# Patient Record
Sex: Male | Born: 1960 | Race: White | Hispanic: No | Marital: Married | Smoking: Former smoker
Health system: Southern US, Community
[De-identification: ages and names within clinical notes are randomized; demographics above are authoritative.]

## PROBLEM LIST (undated history)

## (undated) DIAGNOSIS — F419 Anxiety disorder, unspecified: Secondary | ICD-10-CM

## (undated) DIAGNOSIS — G4730 Sleep apnea, unspecified: Secondary | ICD-10-CM

## (undated) DIAGNOSIS — F329 Major depressive disorder, single episode, unspecified: Secondary | ICD-10-CM

## (undated) DIAGNOSIS — F32A Depression, unspecified: Secondary | ICD-10-CM

## (undated) DIAGNOSIS — G250 Essential tremor: Secondary | ICD-10-CM

## (undated) DIAGNOSIS — E119 Type 2 diabetes mellitus without complications: Secondary | ICD-10-CM

## (undated) HISTORY — DX: Essential tremor: G25.0

## (undated) HISTORY — DX: Depression, unspecified: F32.A

## (undated) HISTORY — DX: Anxiety disorder, unspecified: F41.9

## (undated) HISTORY — PX: CATARACT EXTRACTION W/ INTRAOCULAR LENS IMPLANT: SHX1309

## (undated) HISTORY — DX: Sleep apnea, unspecified: G47.30

## (undated) HISTORY — DX: Type 2 diabetes mellitus without complications: E11.9

## (undated) HISTORY — DX: Major depressive disorder, single episode, unspecified: F32.9

---
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
## 1998-10-03 ENCOUNTER — Other Ambulatory Visit: Admission: RE | Admit: 1998-10-03 | Discharge: 1998-10-03 | Payer: Self-pay | Admitting: Urology

## 1998-11-18 ENCOUNTER — Emergency Department (HOSPITAL_COMMUNITY): Admission: EM | Admit: 1998-11-18 | Discharge: 1998-11-18 | Payer: Self-pay | Admitting: *Deleted

## 1998-11-18 ENCOUNTER — Encounter: Payer: Self-pay | Admitting: Urology

## 1999-03-06 ENCOUNTER — Encounter: Payer: Self-pay | Admitting: Urology

## 1999-03-06 ENCOUNTER — Ambulatory Visit (HOSPITAL_COMMUNITY): Admission: RE | Admit: 1999-03-06 | Discharge: 1999-03-06 | Payer: Self-pay | Admitting: Urology

## 2006-03-02 ENCOUNTER — Ambulatory Visit (HOSPITAL_COMMUNITY): Admission: RE | Admit: 2006-03-02 | Discharge: 2006-03-02 | Payer: Self-pay | Admitting: Gastroenterology

## 2006-03-16 HISTORY — PX: OTHER SURGICAL HISTORY: SHX169

## 2006-06-28 ENCOUNTER — Ambulatory Visit (HOSPITAL_BASED_OUTPATIENT_CLINIC_OR_DEPARTMENT_OTHER): Admission: RE | Admit: 2006-06-28 | Discharge: 2006-06-28 | Payer: Self-pay | Admitting: Family Medicine

## 2006-07-04 ENCOUNTER — Ambulatory Visit: Payer: Self-pay | Admitting: Internal Medicine

## 2006-09-01 ENCOUNTER — Ambulatory Visit (HOSPITAL_COMMUNITY): Admission: RE | Admit: 2006-09-01 | Discharge: 2006-09-01 | Payer: Self-pay | Admitting: Family Medicine

## 2006-09-09 ENCOUNTER — Ambulatory Visit (HOSPITAL_COMMUNITY): Admission: RE | Admit: 2006-09-09 | Discharge: 2006-09-10 | Payer: Self-pay | Admitting: Neurosurgery

## 2008-01-27 ENCOUNTER — Emergency Department (HOSPITAL_COMMUNITY): Admission: EM | Admit: 2008-01-27 | Discharge: 2008-01-27 | Payer: Self-pay | Admitting: Emergency Medicine

## 2008-02-02 ENCOUNTER — Ambulatory Visit (HOSPITAL_COMMUNITY): Admission: RE | Admit: 2008-02-02 | Discharge: 2008-02-02 | Payer: Self-pay | Admitting: Family Medicine

## 2010-07-29 NOTE — Op Note (Signed)
NAMEJEROMIAH, Steven Mays                 ACCOUNT NO.:  [id number]   MEDICAL RECORD NO.:  [id number]          PATIENT TYPE:  OIB   LOCATION:  3027                         FACILITY:  [facility]   PHYSICIAN:  Kathaleen Maser. Pool, M.D.    DATE OF BIRTH:  [birth year]   DATE OF PROCEDURE:  09/09/2006  DATE OF DISCHARGE:                               OPERATIVE REPORT   PREOPERATIVE DIAGNOSIS:  Left L5-S1 herniated nucleus pulposus with  radiculopathy.   POSTOPERATIVE DIAGNOSIS:  Left L5-S1 herniated nucleus pulposus with  radiculopathy.   PROCEDURE NOTE:  Left L5-S1 laminotomy with microdiscectomy.   SURGEON:  Kathaleen Maser. Pool, M.D.   ASSISTANT:  Tia Alert, M.D.   ANESTHESIA:  General orotracheal.   INDICATIONS FOR PROCEDURE:  Mr. Mays is a 50 year old male with a  history of severe back and left lower extremity pain failing  conservative management.  Workup demonstrates evidence of a left-sided  L5-S1 disc herniation with an inferior fragment causing compression of  the thecal sac and left S1 and S2 nerve roots.  The patient has been  counseled as to his options.  He decided to proceed with a left sided L5-  S1 laminotomy and microdiscectomy in hopes of improving his symptoms.   OPERATIVE NOTE:  The patient was brought to the operating room and  placed on the operating table in a supine position. After an adequate  level of anesthesia was achieved, the patient was positioned prone on  the Wilson frame and appropriately padded.  The lumbar region was  prepped and draped sterilely.  A knife was used to make a linear skin  incision overlying the L5-S1 interspace.  This was carried down sharply  in the midline.  A subperiosteal dissection was performed exposing the  lamina and facet joints of L5 and S1 on the left side.  Deep self-  retaining retractor was placed.  Intraoperative x-ray was taken and the  level was confirmed.  A laminotomy was then performed using a high speed  drill and Kerrison  rongeurs to remove the inferior aspect of the lamina  of L5, medial aspect of the L5-S1 facet joint, and the superior rim of  the S1 lamina.  The ligamentum flavum was then elevated and resected in  a piecemeal fashion using Kerrison rongeurs.  The underlying thecal sac  and exiting S1 nerve root were identified.  The microscope was then  brought onto the field and used for microdissection of the left sided S1  nerve root and underlying disc herniation.  Epidural venous plexus was  coagulated and cut.  The thecal sac and S1 nerve root were mobilized and  retracted towards the midline.  The disc herniation was readily  apparent.  This was incised with a 15 blade in rectangular fracture.  Wide disc space clean out was then achieved using pituitary rongeurs, up  angled pituitary rongeurs, and Epstein curets.  All elements of the disc  herniation were completely resected.  All loose or obviously  degenerative disc material was removed from the interspace.  At this  point, a very thorough  discectomy had been achieved.  There was no  evidence of injury to thecal sac or nerve roots.  These was no residual  compression upon the thecal sac or nerve roots.  The wound was irrigated  with antibiotic solution.  Gelfoam was placed topically for hemostasis  which was found to be good.  The microscope and retractor system  were removed.  Hemostasis in the muscles was achieved with  electrocautery and the wound was closed in layers with Vicryl sutures.  Steri-Strips and sterile dressings were applied.  There were no  intraoperative complications.  The patient tolerated the procedure well  and he returns to the recovery room for postoperative care.           ______________________________  Kathaleen Maser. Pool, M.D.     HAP/MEDQ  D:  09/09/2006  T:  09/09/2006  Job:  [id number]

## 2010-08-01 NOTE — Procedures (Signed)
NAME:  Steven Mays, Steven Mays                 ACCOUNT NO.:  [id number]   MEDICAL RECORD NO.:  [id number]          PATIENT TYPE:  OUT   LOCATION:  SLEEP CENTER                 FACILITY[facility]  [facility]   PHYSICIAN:  Clinton D. Maple Hudson, MD, FCCP, FACPDATE OF BIRTH:  03-25-1961   DATE OF STUDY:  06/28/2006                            NOCTURNAL POLYSOMNOGRAM   REFERRING PHYSICIAN:   INDICATION FOR STUDY:  Hypersomnia with sleep apnea.   EPWORTH SLEEPINESS SCORE:  16/24, BMI 27.1, weight 190 pounds.   HOME MEDICATIONS:  Wellbutrin, aspirin.   SLEEP ARCHITECTURE:  Total sleep time 238 minutes with sleep efficiency  60%.  Stage 1 was 32%, stage 2 68%, stages 3, 4, and REM were absent,  sleep latency 16 minutes, awake after sleep onset 140 minutes, arousal  index 33.  There were frequent sustained awakenings throughout the night  until about 3 a.m., which were nonspecific.  No sleep medication was  taken.   RESPIRATORY DATA:  Apnea/hypopnea index (AHI, RDI) 16.1 obstructive  events per hour indicating moderate obstructive sleep apnea/hypopnea  syndrome.  There were 22 obstructive apneas and 42 hypopneas.  Most  events developed very late in the night.  Events were much more common  while supine, although not exclusively in that position.  There were  insufficient early events to permit CPAP titration by split protocol on  this study night.   OXYGEN DATA:  Moderate to occasionally loud snoring with oxygen  desaturation to a nadir of 82%.  Mean oxygen saturation through the  study was 92% on room air.   CARDIAC DATA:  Sinus rhythm with occasional PVC.   MOVEMENT-PARASOMNIA:  A total of 35 limb jerks were recorded of which 19  were associated with arousal or awakening for a periodic limb movement  with arousal index of 4.8 per hour, which is increased.  Bathroom x1.  Technician described the patient as very restless with difficulty  maintaining sleep.   IMPRESSION/RECOMMENDATION:  1. Short total sleep  time lacking in deep and Rapid Eye Movement (REM)      sleep stages and marked by frequent nonspecific waking's through      the night.  The patient appeared to be restless.  Treatment for      insomnia may be warranted.  2. Moderate obstructive sleep apnea/hypopnea syndrome, apnea/hypopnea      index (AHI)16.1 obstructive events per hour.  Events were more      common while supine as expected.  Snoring was moderate to loud with      oxygen desaturation to a nadir of 82%.  3. Sleep was not sustained well enough to permit continuous positive      airway pressure (CPAP) titration on this study night.  Consider      return for continuous positive airway pressure (CPAP) titration      bringing a sleep      medication as appropriate.  4. Mild periodic limb movement with arousal syndrome, 4.8 per hour.      Clinton D. Maple Hudson, MD, FCCP, FACP  Diplomate, Biomedical engineer of Sleep Medicine  Electronically Signed     CDY/MEDQ  D:  07/04/2006  13:11:59  T:  07/04/2006 17:54:45  Job:  [id number]

## 2010-12-31 LAB — CBC
Hemoglobin: 15.1
MCHC: 33.5
RDW: 14.2 — ABNORMAL HIGH

## 2011-12-02 ENCOUNTER — Encounter: Payer: Self-pay | Admitting: Gastroenterology

## 2011-12-28 ENCOUNTER — Ambulatory Visit (AMBULATORY_SURGERY_CENTER): Payer: BC Managed Care – PPO | Admitting: *Deleted

## 2011-12-28 ENCOUNTER — Encounter: Payer: Self-pay | Admitting: Gastroenterology

## 2011-12-28 VITALS — Ht 70.0 in | Wt 235.1 lb

## 2011-12-28 DIAGNOSIS — Z1211 Encounter for screening for malignant neoplasm of colon: Secondary | ICD-10-CM

## 2011-12-28 MED ORDER — NA SULFATE-K SULFATE-MG SULF 17.5-3.13-1.6 GM/177ML PO SOLN: ORAL | Status: DC

## 2012-01-08 ENCOUNTER — Ambulatory Visit (AMBULATORY_SURGERY_CENTER): Payer: BC Managed Care – PPO | Admitting: Gastroenterology

## 2012-01-08 ENCOUNTER — Encounter: Payer: Self-pay | Admitting: Gastroenterology

## 2012-01-08 VITALS — BP 141/86 | HR 90 | Temp 97.6°F | Resp 19 | Ht 70.0 in | Wt 235.0 lb

## 2012-01-08 DIAGNOSIS — D126 Benign neoplasm of colon, unspecified: Secondary | ICD-10-CM

## 2012-01-08 DIAGNOSIS — K5730 Diverticulosis of large intestine without perforation or abscess without bleeding: Secondary | ICD-10-CM

## 2012-01-08 DIAGNOSIS — Z1211 Encounter for screening for malignant neoplasm of colon: Secondary | ICD-10-CM

## 2012-01-08 MED ORDER — SODIUM CHLORIDE 0.9 % IV SOLN: 500.0000 mL | INTRAVENOUS | Status: DC

## 2012-01-08 NOTE — Progress Notes (Signed)
Patient did not experience any of the following events: a burn prior to discharge; a fall within the facility; wrong site/side/patient/procedure/implant event; or a hospital transfer or hospital admission upon discharge from the facility. (G8907) Patient did not have preoperative order for IV antibiotic SSI prophylaxis. (G8918)  

## 2012-01-08 NOTE — Op Note (Signed)
 [facility] [address] [place], [address]   COLONOSCOPY PROCEDURE REPORT  PATIENT: Steven Mays, Steven Mays  MR#: [id number] BIRTHDATE: 1961-01-20 , 51  yrs. old GENDER: Male ENDOSCOPIST: Louis Meckel, MD REFERRED OZ:HYQMVH Ehinger, M.D. PROCEDURE DATE:  01/08/2012 PROCEDURE:   Colonoscopy with snare polypectomy ASA CLASS:   Class II INDICATIONS:average risk screening. MEDICATIONS: MAC sedation, administered by CRNA, Propofol (Diprivan), and Propofol (Diprivan) 230 mg IV  DESCRIPTION OF PROCEDURE:   After the risks benefits and alternatives of the procedure were thoroughly explained, informed consent was obtained.  A digital rectal exam revealed no abnormalities of the rectum.   The LB [id number]  endoscope was introduced through the anus and advanced to the cecum, which was identified by both the appendix and ileocecal valve. No adverse events experienced.   The quality of the prep was Suprep excellent The instrument was then slowly withdrawn as the colon was fully examined.      COLON FINDINGS: A sessile polyp measuring 3 mm in size was found in the sigmoid colon.  A polypectomy was performed with a cold snare. The resection was complete and the polyp tissue was completely retrieved.   Mild diverticulosis was noted in the sigmoid colon. The colon mucosa was otherwise normal.  Retroflexed views revealed no abnormalities. The time to cecum=2 minutes 21 seconds. Withdrawal time=9 minutes 02 seconds.  The scope was withdrawn and the procedure completed. COMPLICATIONS: There were no complications.  ENDOSCOPIC IMPRESSION: 1.   Sessile polyp measuring 3 mm in size was found in the sigmoid colon; polypectomy was performed with a cold snare 2.   Mild diverticulosis was noted in the sigmoid colon 3.   The colon mucosa was otherwise normal  RECOMMENDATIONS: If the polyp(s) removed today are proven to be adenomatous (pre-cancerous) polyps, you will need a  repeat colonoscopy in 5 years.  Otherwise you should continue to follow colorectal cancer screening guidelines for "routine risk" patients with colonoscopy in 10 years.  You will receive a letter within 1-2 weeks with the results of your biopsy as well as final recommendations.  Please call my office if you have not received a letter after 3 weeks.   eSigned:  Louis Meckel, MD 01/08/2012 9:10 AM   cc:

## 2012-01-08 NOTE — Patient Instructions (Addendum)
YOU HAD AN ENDOSCOPIC PROCEDURE TODAY AT THE [facility]: Refer to the procedure report that was given to you for any specific questions about what was found during the examination.  If the procedure report does not answer your questions, please call your gastroenterologist to clarify.  If you requested that your care partner not be given the details of your procedure findings, then the procedure report has been included in a sealed envelope for you to review at your convenience later.  YOU SHOULD EXPECT: Some feelings of bloating in the abdomen. Passage of more gas than usual.  Walking can help get rid of the air that was put into your GI tract during the procedure and reduce the bloating. If you had a lower endoscopy (such as a colonoscopy or flexible sigmoidoscopy) you may notice spotting of blood in your stool or on the toilet paper. If you underwent a bowel prep for your procedure, then you may not have a normal bowel movement for a few days.  DIET: Your first meal following the procedure should be a light meal and then it is ok to progress to your normal diet.  A half-sandwich or bowl of soup is an example of a good first meal.  Heavy or fried foods are harder to digest and may make you feel nauseous or bloated.  Likewise meals heavy in dairy and vegetables can cause extra gas to form and this can also increase the bloating.  Drink plenty of fluids but you should avoid alcoholic beverages for 24 hours.  ACTIVITY: Your care partner should take you home directly after the procedure.  You should plan to take it easy, moving slowly for the rest of the day.  You can resume normal activity the day after the procedure however you should NOT DRIVE or use heavy machinery for 24 hours (because of the sedation medicines used during the test).    SYMPTOMS TO REPORT IMMEDIATELY: A gastroenterologist can be reached at any hour.  During normal business hours, 8:30 AM to 5:00 PM Monday through Friday,  call [phone].  After hours and on weekends, please call the GI answering service at [phone] who will take a message and have the physician on call contact you.   Following lower endoscopy (colonoscopy or flexible sigmoidoscopy):  Excessive amounts of blood in the stool  Significant tenderness or worsening of abdominal pains  Swelling of the abdomen that is new, acute  Fever of 100F or higher    FOLLOW UP: If any biopsies were taken you will be contacted by phone or by letter within the next 1-3 weeks.  Call your gastroenterologist if you have not heard about the biopsies in 3 weeks.  Our staff will call the home number listed on your records the next business day following your procedure to check on you and address any questions or concerns that you may have at that time regarding the information given to you following your procedure. This is a courtesy call and so if there is no answer at the home number and we have not heard from you through the emergency physician on call, we will assume that you have returned to your regular daily activities without incident.  SIGNATURES/CONFIDENTIALITY: You and/or your care partner have signed paperwork which will be entered into your electronic medical record.  These signatures attest to the fact that that the information above on your After Visit Summary has been reviewed and is understood.  Full responsibility of the confidentiality   of this discharge information lies with you and/or your care-partner.     INFORMATION ON POLYPS GIVEN TO YOU TODAY  INFORMATION ON DIVERTICULOSIS GIVEN TO YOU TODAY

## 2012-01-11 ENCOUNTER — Telehealth: Payer: Self-pay

## 2012-01-11 NOTE — Telephone Encounter (Signed)
  Follow up Call-  Call back number 01/08/2012  Post procedure Call Back phone  # [phone]  Permission to leave phone message Yes     Patient questions:  Do you have a fever, pain , or abdominal swelling? no Pain Score  0 *  Have you tolerated food without any problems? yes  Have you been able to return to your normal activities? yes  Do you have any questions about your discharge instructions: Diet   no Medications  no Follow up visit  no  Do you have questions or concerns about your Care? no  Actions: * If pain score is 4 or above: No action needed, pain <4.

## 2012-01-12 ENCOUNTER — Encounter: Payer: Self-pay | Admitting: Gastroenterology

## 2013-01-27 ENCOUNTER — Ambulatory Visit: Payer: Self-pay

## 2013-02-01 ENCOUNTER — Ambulatory Visit: Payer: Self-pay | Admitting: Podiatry

## 2013-02-04 ENCOUNTER — Encounter (HOSPITAL_BASED_OUTPATIENT_CLINIC_OR_DEPARTMENT_OTHER): Payer: Self-pay | Admitting: Emergency Medicine

## 2013-02-04 ENCOUNTER — Emergency Department (HOSPITAL_BASED_OUTPATIENT_CLINIC_OR_DEPARTMENT_OTHER)
Admission: EM | Admit: 2013-02-04 | Discharge: 2013-02-04 | Disposition: A | Discharge status: Home / Self Care | Payer: BC Managed Care – PPO | Attending: Emergency Medicine | Admitting: Emergency Medicine

## 2013-02-04 ENCOUNTER — Emergency Department (HOSPITAL_BASED_OUTPATIENT_CLINIC_OR_DEPARTMENT_OTHER): Payer: BC Managed Care – PPO

## 2013-02-04 DIAGNOSIS — L03111 Cellulitis of right axilla: Secondary | ICD-10-CM

## 2013-02-04 DIAGNOSIS — Z87891 Personal history of nicotine dependence: Secondary | ICD-10-CM | POA: Insufficient documentation

## 2013-02-04 DIAGNOSIS — R509 Fever, unspecified: Secondary | ICD-10-CM | POA: Insufficient documentation

## 2013-02-04 DIAGNOSIS — F411 Generalized anxiety disorder: Secondary | ICD-10-CM | POA: Insufficient documentation

## 2013-02-04 DIAGNOSIS — Z8669 Personal history of other diseases of the nervous system and sense organs: Secondary | ICD-10-CM | POA: Insufficient documentation

## 2013-02-04 DIAGNOSIS — F329 Major depressive disorder, single episode, unspecified: Secondary | ICD-10-CM | POA: Insufficient documentation

## 2013-02-04 DIAGNOSIS — Z79899 Other long term (current) drug therapy: Secondary | ICD-10-CM | POA: Insufficient documentation

## 2013-02-04 DIAGNOSIS — R000 Tachycardia, unspecified: Secondary | ICD-10-CM | POA: Insufficient documentation

## 2013-02-04 DIAGNOSIS — Z7982 Long term (current) use of aspirin: Secondary | ICD-10-CM | POA: Insufficient documentation

## 2013-02-04 DIAGNOSIS — F3289 Other specified depressive episodes: Secondary | ICD-10-CM | POA: Insufficient documentation

## 2013-02-04 DIAGNOSIS — IMO0002 Reserved for concepts with insufficient information to code with codable children: Secondary | ICD-10-CM | POA: Insufficient documentation

## 2013-02-04 LAB — CBC WITH DIFFERENTIAL/PLATELET
Basophils Absolute: 0 10*3/uL (ref 0.0–0.1)
Basophils Relative: 0 % (ref 0–1)
Eosinophils Absolute: 0 10*3/uL (ref 0.0–0.7)
Lymphs Abs: 0.2 10*3/uL — ABNORMAL LOW (ref 0.7–4.0)
MCH: 29 pg (ref 26.0–34.0)
Neutrophils Relative %: 96 % — ABNORMAL HIGH (ref 43–77)
Platelets: 132 10*3/uL — ABNORMAL LOW (ref 150–400)
RBC: 5.35 MIL/uL (ref 4.22–5.81)
RDW: 15.2 % (ref 11.5–15.5)

## 2013-02-04 LAB — URINALYSIS, ROUTINE W REFLEX MICROSCOPIC
Bilirubin Urine: NEGATIVE
Glucose, UA: NEGATIVE mg/dL
Hgb urine dipstick: NEGATIVE
Ketones, ur: NEGATIVE mg/dL
Specific Gravity, Urine: 1.007 (ref 1.005–1.030)
pH: 6 (ref 5.0–8.0)

## 2013-02-04 LAB — COMPREHENSIVE METABOLIC PANEL
ALT: 45 U/L (ref 0–53)
AST: 53 U/L — ABNORMAL HIGH (ref 0–37)
Albumin: 3.7 g/dL (ref 3.5–5.2)
Alkaline Phosphatase: 77 U/L (ref 39–117)
Glucose, Bld: 182 mg/dL — ABNORMAL HIGH (ref 70–99)
Potassium: 3.9 mEq/L (ref 3.5–5.1)
Sodium: 129 mEq/L — ABNORMAL LOW (ref 135–145)
Total Protein: 7 g/dL (ref 6.0–8.3)

## 2013-02-04 MED ORDER — ACETAMINOPHEN 325 MG PO TABS: 650.0000 mg | ORAL_TABLET | Freq: Once | ORAL | Status: DC

## 2013-02-04 MED ORDER — HYDROCODONE-ACETAMINOPHEN 5-325 MG PO TABS: 2.0000 | ORAL_TABLET | Freq: Four times a day (QID) | ORAL | Status: DC | PRN

## 2013-02-04 MED ORDER — CLINDAMYCIN HCL 300 MG PO CAPS: 300.0000 mg | ORAL_CAPSULE | Freq: Four times a day (QID) | ORAL | Status: DC

## 2013-02-04 MED ORDER — FENTANYL CITRATE 0.05 MG/ML IJ SOLN
INTRAMUSCULAR | Status: AC
  Filled 2013-02-04: qty 2

## 2013-02-04 MED ORDER — ACETAMINOPHEN 500 MG PO TABS
1000.0000 mg | ORAL_TABLET | Freq: Once | ORAL | Status: AC
  Administered 2013-02-04: 1000 mg via ORAL

## 2013-02-04 MED ORDER — SODIUM CHLORIDE 0.9 % IV BOLUS (SEPSIS)
2000.0000 mL | Freq: Once | INTRAVENOUS | Status: AC
  Administered 2013-02-04: 2000 mL via INTRAVENOUS

## 2013-02-04 MED ORDER — ACETAMINOPHEN 500 MG PO TABS
ORAL_TABLET | ORAL | Status: AC
  Administered 2013-02-04: 1000 mg via ORAL
  Filled 2013-02-04: qty 2

## 2013-02-04 MED ORDER — SODIUM CHLORIDE 0.9 % IV BOLUS (SEPSIS)
1000.0000 mL | Freq: Once | INTRAVENOUS | Status: AC
  Administered 2013-02-04: 1000 mL via INTRAVENOUS

## 2013-02-04 MED ORDER — FENTANYL CITRATE 0.05 MG/ML IJ SOLN
50.0000 ug | Freq: Once | INTRAMUSCULAR | Status: AC
  Administered 2013-02-04: 50 ug via INTRAVENOUS

## 2013-02-04 MED ORDER — CLINDAMYCIN PHOSPHATE 600 MG/50ML IV SOLN
600.0000 mg | Freq: Once | INTRAVENOUS | Status: AC
  Administered 2013-02-04: 600 mg via INTRAVENOUS
  Filled 2013-02-04: qty 50

## 2013-02-04 NOTE — ED Provider Notes (Signed)
CSN: [id number]     Arrival date & time 02/04/13  1710 History  This chart was scribed for Charles B. Bernette Mayers, MD by Clydene Laming, ED Scribe. This patient was seen in room MH12/MH12 and the patient's care was started at 5:28 PM.   Chief Complaint  Patient presents with  . Lymphadenopathy    The history is provided by the patient. No language interpreter was used.  HPI Comments: Steven Mays is a 52 y.o. male who presents to the Emergency Department complaining of lymphadenopathy with an associated fever. Pt states he first noticed the abscess under his right arm two days ago and began to feel the pain. He states that the fever began this morning. Pt was seen at [facility] today and was sent here. Pt denies rhinorrhea or sore throat. He has not noticed any other abnormalities of the skin. Pt works in an ER.     Past Medical History  Diagnosis Date  . Essential tremor   . Anxiety   . Depression   . Sleep apnea     cannot tolerate cpap   Past Surgical History  Procedure Laterality Date  . Tonsillectomy  1981  . Lumbar lamincectomy  2008   Family History  Problem Relation Age of Onset  . Colon cancer Neg Hx   . Stomach cancer Neg Hx    History  Substance Use Topics  . Smoking status: Former Smoker    Quit date: 12/28/2006  . Smokeless tobacco: Never Used  . Alcohol Use: 1.2 oz/week    2 Cans of beer per week    Review of Systems  Constitutional: Positive for fever.  Skin:       Abscess under right arm  All other systems reviewed and are negative.    Allergies  Tetracyclines & related  Home Medications   Current Outpatient Rx  Name  Route  Sig  Dispense  Refill  . ALPRAZolam (XANAX) 0.5 MG tablet   Oral   Take 0.5 mg by mouth as needed.          Marland Kitchen aspirin 81 MG tablet   Oral   Take 81 mg by mouth daily.         Marland Kitchen buPROPion (WELLBUTRIN XL) 300 MG 24 hr tablet   Oral   Take 300 mg by mouth daily.         . busPIRone (BUSPAR) 15 MG  tablet   Oral   Take 15 mg by mouth daily.         . Multiple Vitamin (MULTIVITAMIN) tablet   Oral   Take 1 tablet by mouth daily.         . propranolol (INNOPRAN XL) 80 MG 24 hr capsule   Oral   Take 80 mg by mouth daily.          Triage Vitals:BP 127/64  Pulse 131  Temp(Src) 102.8 F (39.3 C) (Oral)  Resp 16  Ht 5\' 10"  (1.778 m)  Wt 215 lb (97.523 kg)  BMI 30.85 kg/m2  SpO2 94% Physical Exam  Nursing note and vitals reviewed. Constitutional: He is oriented to person, place, and time. He appears well-developed and well-nourished.  HENT:  Head: Normocephalic and atraumatic.  Eyes: EOM are normal. Pupils are equal, round, and reactive to light.  Neck: Normal range of motion. Neck supple.  Cardiovascular: Normal rate, normal heart sounds and intact distal pulses.   Pulmonary/Chest: Effort normal and breath sounds normal.  Abdominal: Bowel sounds are  normal. He exhibits no distension. There is no tenderness.  Musculoskeletal: Normal range of motion. He exhibits no edema and no tenderness.  Neurological: He is alert and oriented to person, place, and time. He has normal strength. No cranial nerve deficit or sensory deficit.  Skin: Skin is warm and dry. No rash noted.  Psychiatric: He has a normal mood and affect.    ED Course  Procedures (including critical care time) DIAGNOSTIC STUDIES: Oxygen Saturation is 94% on RA, normal by my interpretation.    COORDINATION OF CARE: 5:40 PM- Discussed treatment plan with pt at bedside. Pt verbalized understanding and agreement with plan.   Labs Review Labs Reviewed  CBC WITH DIFFERENTIAL - Abnormal; Notable for the following:    WBC 13.6 (*)    Platelets 132 (*)    Neutrophils Relative % 96 (*)    Neutro Abs 13.0 (*)    Lymphocytes Relative 1 (*)    Lymphs Abs 0.2 (*)    All other components within normal limits  COMPREHENSIVE METABOLIC PANEL - Abnormal; Notable for the following:    Sodium 129 (*)    Chloride 92 (*)     Glucose, Bld 182 (*)    AST 53 (*)    Total Bilirubin 1.6 (*)    GFR calc non Af Amer 68 (*)    GFR calc Af Amer 79 (*)    All other components within normal limits   Imaging Review No results found.  EKG Interpretation   None       MDM   1. Cellulitis of right axilla   2. Tachycardia     Limited bedside US does not show fluid collection in R axilla. Per PCP, she attempted needle aspiration without success as well. Appears to be soft tissue infection as source of his fever. He is otherwise asymptomatic. Will give antipyretics and IVF as well as Clindamycin and reassess.   10:50 PM After 2000cc fluid bolus, patient still tachycardic, complaining of myalgias. Additional labs and CXR ordered to eval for occult source of infection but neg. A 3rd liter of fluid given as well as fentanyl for back pain and patient now feeling much better. Will d/c with clindamycin for axillary cellulitis, pain meds, rest, encourage PO fluids. PCP followup in 48hrs.   Note: pt states he may have forgotten to take his dose of propranolol this AM (for tremor) which may partially explain the persistent tachycardia.   I personally performed the services described in this documentation, which was scribed in my presence. The recorded information has been reviewed and is accurate.       Charles B. Bernette Mayers, MD 02/04/13 2255

## 2013-02-04 NOTE — ED Notes (Signed)
Pt noticed large swollen, painful area under right arm on Thursday.  Fever this am.   Seen at [facility] walk in clinic and sent here.

## 2013-06-12 ENCOUNTER — Encounter: Payer: Self-pay | Admitting: Dietician

## 2013-06-12 ENCOUNTER — Encounter: Payer: Commercial Managed Care - PPO | Attending: Family Medicine | Admitting: Dietician

## 2013-06-12 VITALS — Ht 70.0 in | Wt 240.6 lb

## 2013-06-12 DIAGNOSIS — E669 Obesity, unspecified: Secondary | ICD-10-CM

## 2013-06-12 DIAGNOSIS — Z713 Dietary counseling and surveillance: Secondary | ICD-10-CM | POA: Insufficient documentation

## 2013-06-12 NOTE — Patient Instructions (Signed)
Walk 4 x week on days off for 30-45 minutes.  Aim to get 45-60 g of carbohydrates at meals and up to 15 g of carbohydrates at snacks. Eat protein with carbohydrates at meals and snacks. Look to add vegetables (pre washed or frozen when at home) to meals. Try to drink beverages with no carbs (unsweet tea with sweet and low or diet soda).  Consider limiting beer to help reduce calories.

## 2013-06-12 NOTE — Progress Notes (Signed)
  Medical Nutrition Therapy:  Appt start time: 0830 end time:  0945.   Assessment:  Primary concerns today: Steven Mays is here today since his Hgb A1c has been going up. It was at 6.4% in November and two week ago was about 6.6%. Has been gained weight in the past year - about 15-20 lbs. 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  Lives with his spouse and states that he is responsible for the food preparation. States his wife does not cook and is an Therapist, sports, CDE. Goes out to eat 1-2 x week. Doing more snacking on his time off. Portions are probably average to large and eats quickly at work and slower at home.   Would like weight about 190 lbs which is when he felt his best. Was playing racquetball, exercising, snacking less at that weight.    Preferred Learning Style:   No preference indicated   Learning Readiness:   Ready  Change in progress  MEDICATIONS: see list   DIETARY INTAKE:  Avoided foods include: olives    24-hr recall:  B ( AM): peanut and jelly sandwich or cup of yogurt with a cup of cereal with coffee, cream and sweet and low  Snk ( AM): sometimes will have a granola bar  L ( PM): cafeteria - chicken breast or pot roast or salad, sweet tea half sweet Snk ( PM): none D ( PM): 8 PM after work, baked chicken and rice or nachos with chicken and cheese  Snk ( PM): sometimes will have "whatever is in the fridge" egg custard, cookies Beverages: coffee, half sweet tea, skim milk   Usual physical activity: yard work  Estimated energy needs: 2000 calories 225 g carbohydrates 150 g protein 56 g fat  Progress Towards Goal(s):  In progress.   Nutritional Diagnosis:  Corwith-3.3 Overweight/obesity As related to frequent snacking and lack of scheduled physical activity.  As evidenced by BMI of 34.5 and 15-20 lbs weight gain in past year.    Intervention:  Nutrition counseling provided. Plan: Walk 4 x week on days off for 30-45  minutes.  Aim to get 45-60 g of carbohydrates at meals and up to 15 g of carbohydrates at snacks. Eat protein with carbohydrates at meals and snacks. Look to add vegetables (pre washed or frozen when at home) to meals. Try to drink beverages with no carbs (unsweet tea with sweet and low or diet soda).  Consider limiting beer to help reduce calories.   Teaching Method Utilized:  Visual Auditory Hands on  Handouts given during visit include:  Living Well with Diabetes  Yellow Card  MyPlate Handout  15 g CHO Snacks  Barriers to learning/adherence to lifestyle change: none  Demonstrated degree of understanding via:  Teach Back   Monitoring/Evaluation:  Dietary intake, exercise, and body weight in 3 month(s).

## 2013-09-14 ENCOUNTER — Ambulatory Visit: Payer: BC Managed Care – PPO | Admitting: Dietician

## 2013-11-14 ENCOUNTER — Ambulatory Visit: Payer: BC Managed Care – PPO | Admitting: Dietician

## 2014-08-21 DIAGNOSIS — F32A Depression, unspecified: Secondary | ICD-10-CM | POA: Insufficient documentation

## 2014-08-21 DIAGNOSIS — R251 Tremor, unspecified: Secondary | ICD-10-CM | POA: Insufficient documentation

## 2014-08-21 DIAGNOSIS — E119 Type 2 diabetes mellitus without complications: Secondary | ICD-10-CM | POA: Insufficient documentation

## 2017-03-16 HISTORY — PX: CERVICAL LAMINECTOMY: SHX94

## 2017-04-13 ENCOUNTER — Other Ambulatory Visit: Payer: Self-pay | Admitting: Physician Assistant

## 2017-04-13 DIAGNOSIS — M79602 Pain in left arm: Secondary | ICD-10-CM

## 2017-04-17 ENCOUNTER — Ambulatory Visit
Admission: RE | Admit: 2017-04-17 | Discharge: 2017-04-17 | Disposition: A | Discharge status: Home / Self Care | Payer: Commercial Managed Care - PPO | Source: Ambulatory Visit | Attending: Physician Assistant | Admitting: Physician Assistant

## 2017-04-17 DIAGNOSIS — M79602 Pain in left arm: Secondary | ICD-10-CM

## 2018-03-02 ENCOUNTER — Ambulatory Visit: Payer: Commercial Managed Care - PPO | Admitting: Endocrinology

## 2018-03-22 ENCOUNTER — Encounter: Payer: Self-pay | Admitting: Endocrinology

## 2018-03-22 ENCOUNTER — Ambulatory Visit: Payer: Commercial Managed Care - PPO | Admitting: Endocrinology

## 2018-03-22 DIAGNOSIS — E119 Type 2 diabetes mellitus without complications: Secondary | ICD-10-CM | POA: Diagnosis not present

## 2018-03-22 LAB — POCT GLYCOSYLATED HEMOGLOBIN (HGB A1C): HEMOGLOBIN A1C: 10.7 % — AB (ref 4.0–5.6)

## 2018-03-22 MED ORDER — COLESEVELAM HCL 625 MG PO TABS: 1875.0000 mg | ORAL_TABLET | Freq: Every day | ORAL | 11 refills | Status: DC

## 2018-03-22 MED ORDER — GLUCOSE BLOOD VI STRP: 1.0000 | ORAL_STRIP | Freq: Every day | 3 refills | Status: DC

## 2018-03-22 NOTE — Progress Notes (Signed)
Subjective:    Patient ID: Steven Mays, male    DOB: [birth year], 58 y.o.   MRN: [id number]  HPI pt is referred by Dr Marisue Humble, for diabetes.  Pt states DM was dx'ed in 2016; he has mild if any neuropathy of the lower extremities; he is unaware of any associated chronic complications; he has never been on insulin; pt says his diet is poor, but exercise is improved recently; he has never had pancreatitis, pancreatic surgery, severe hypoglycemia or DKA.  He takes trulicity and 2 oral meds.  He did not tolerate metformin (diarrhea).  He says fasting cbg's are in the low-100's.   Past Medical History:  Diagnosis Date  . Anxiety   . Depression   . Diabetes mellitus without complication ([place])   . Essential tremor   . Sleep apnea    cannot tolerate cpap    Past Surgical History:  Procedure Laterality Date  . lumbar lamincectomy  2008  . TONSILLECTOMY  1981    Social History   Socioeconomic History  . Marital status: Married    Spouse name: Not on file  . Number of children: Not on file  . Years of education: Not on file  . Highest education level: Not on file  Occupational History  . Not on file  Social Needs  . Financial resource strain: Not on file  . Food insecurity:    Worry: Not on file    Inability: Not on file  . Transportation needs:    Medical: Not on file    Non-medical: Not on file  Tobacco Use  . Smoking status: Former Smoker    Last attempt to quit: 12/28/2006    Years since quitting: 11.2  . Smokeless tobacco: Never Used  Substance and Sexual Activity  . Alcohol use: Yes    Alcohol/week: 2.0 standard drinks    Types: 2 Cans of beer per week  . Drug use: No  . Sexual activity: Not on file  Lifestyle  . Physical activity:    Days per week: Not on file    Minutes per session: Not on file  . Stress: Not on file  Relationships  . Social connections:    Talks on phone: Not on file    Gets together: Not on file    Attends religious service: Not on file    Active member of club or organization: Not on file    Attends meetings of clubs or organizations: Not on file    Relationship status: Not on file  . Intimate partner violence:    Fear of current or ex partner: Not on file    Emotionally abused: Not on file    Physically abused: Not on file    Forced sexual activity: Not on file  Other Topics Concern  . Not on file  Social History Narrative  . Not on file    Current Outpatient Medications on File Prior to Visit  Medication Sig Dispense Refill  . ALPRAZolam (XANAX) 0.5 MG tablet Take 0.5 mg by mouth as needed.     Marland Kitchen aspirin 81 MG tablet Take 81 mg by mouth daily.    Marland Kitchen buPROPion (WELLBUTRIN XL) 300 MG 24 hr tablet Take 300 mg by mouth daily.    . Dulaglutide (TRULICITY) 1.5 VZ/5.6LO SOPN Inject into the skin.    Marland Kitchen empagliflozin (JARDIANCE) 25 MG TABS tablet Take 25 mg by mouth daily.    Marland Kitchen glimepiride (AMARYL) 2 MG tablet Take 2 mg by mouth  daily with breakfast.    . Multiple Vitamin (MULTIVITAMIN) tablet Take 1 tablet by mouth daily.    . propranolol (INNOPRAN XL) 80 MG 24 hr capsule Take 80 mg by mouth daily.    Marland Kitchen testosterone enanthate (DELATESTRYL) 200 MG/ML injection Inject into the muscle every 14 (fourteen) days. For IM use only    . traZODone (DESYREL) 100 MG tablet Take 100 mg by mouth at bedtime.    . busPIRone (BUSPAR) 15 MG tablet Take 15 mg by mouth daily.    . clindamycin (CLEOCIN) 300 MG capsule Take 1 capsule (300 mg total) by mouth 4 (four) times daily. (Patient not taking: Reported on 03/22/2018) 28 capsule 0  . HYDROcodone-acetaminophen (NORCO/VICODIN) 5-325 MG per tablet Take 2 tablets by mouth every 6 (six) hours as needed. (Patient not taking: Reported on 03/22/2018) 30 tablet 0   No current facility-administered medications on file prior to visit.     Allergies  Allergen Reactions  . Amoxicillin-Pot Clavulanate   . Clindamycin   . Effexor Xr [Venlafaxine Hcl Er] Other (See Comments)  . Metformin And Related  Diarrhea  . Nefazodone Other (See Comments)    hallucinations  . Tetracyclines & Related Nausea And Vomiting    Family History  Problem Relation Age of Onset  . Hypertension Other   . Cancer Other   . Colon cancer Neg Hx   . Stomach cancer Neg Hx   . Diabetes Neg Hx     BP 132/82 (BP Location: Left Arm, Patient Position: Sitting, Cuff Size: Normal)   Pulse 77   Ht 5\' 10"  (1.778 m)   Wt 239 lb 3.2 oz (108.5 kg)   SpO2 96%   BMI 34.32 kg/m   Review of Systems denies weight loss, blurry vision, headache, chest pain, sob, n/v, muscle cramps, excessive diaphoresis, memory loss, cold intolerance, rhinorrhea, and easy bruising.  He has frequent urination.  Depression is well-controlled.      Objective:   Physical Exam VS: see vs page GEN: no distress HEAD: head: no deformity eyes: no periorbital swelling, no proptosis external nose and ears are normal mouth: no lesion seen NECK: a healed scar is present (C-spine procedure).  I do not appreciate a nodule in the thyroid or elsewhere in the .   CHEST WALL: no deformity LUNGS: clear to auscultation CV: reg rate and rhythm, no murmur.   ABD: abdomen is soft, nontender.  no hepatosplenomegaly.  not distended.  no hernia.  MUSCULOSKELETAL: muscle bulk and strength are grossly normal.  no obvious joint swelling.  gait is normal and steady.   EXTEMITIES: no deformity.  no ulcer on the feet.  feet are of normal color and temp.  Trace bilat leg edema, and bilat vv's.   PULSES: dorsalis pedis intact bilat.  no carotid bruit NEURO:  cn 2-12 grossly intact.   readily moves all 4's.  sensation is intact to touch on the feet.  SKIN:  Normal texture and temperature.  No rash or suspicious lesion is visible.   NODES:  None palpable at the neck PSYCH: alert, well-oriented.  Does not appear anxious nor depressed.     A1c=10.7%.    I have reviewed outside records, and summarized: Pt was noted to have elevated a1c, and referred here.  It was  noted that glycemic control worsened despite the addition or more rx.      Assessment & Plan:  Obesity: new to me: he declines surgery Type 2 DM: severe exacerbation: we discussed insulin,  but he declines. Edema: this limits rx options  Patient Instructions  good diet and exercise significantly improve the control of your diabetes.  please let me know if you wish to be referred to a dietician.  high blood sugar is very risky to your health.  you should see an eye doctor and dentist every year.  It is very important to get all recommended vaccinations.  Controlling your blood pressure and cholesterol drastically reduces the damage diabetes does to your body.  Those who smoke should quit.  Please discuss these with your doctor.  check your blood sugar once a day.  vary the time of day when you check, between before the 3 meals, and at bedtime.  also check if you have symptoms of your blood sugar being too high or too low.  please keep a record of the readings and bring it to your next appointment here (or you can bring the meter itself).  You can write it on any piece of paper.  please call us sooner if your blood sugar goes below 70, or if you have a lot of readings over 200. I have sent a prescription to your pharmacy, to add "colevesalam."   Please call or message [place] next week, to tell us how the blood sugar is doing.  We can also add acarbose, metformin-XR, or bromocriptine.   Please come back for a follow-up appointment in 2 months.

## 2018-03-22 NOTE — Patient Instructions (Addendum)
good diet and exercise significantly improve the control of your diabetes.  please let me know if you wish to be referred to a dietician.  high blood sugar is very risky to your health.  you should see an eye doctor and dentist every year.  It is very important to get all recommended vaccinations.  Controlling your blood pressure and cholesterol drastically reduces the damage diabetes does to your body.  Those who smoke should quit.  Please discuss these with your doctor.  check your blood sugar once a day.  vary the time of day when you check, between before the 3 meals, and at bedtime.  also check if you have symptoms of your blood sugar being too high or too low.  please keep a record of the readings and bring it to your next appointment here (or you can bring the meter itself).  You can write it on any piece of paper.  please call us sooner if your blood sugar goes below 70, or if you have a lot of readings over 200. I have sent a prescription to your pharmacy, to add "colevesalam."   Please call or message [place] next week, to tell us how the blood sugar is doing.  We can also add acarbose, metformin-XR, or bromocriptine.   Please come back for a follow-up appointment in 2 months.

## 2018-05-24 ENCOUNTER — Ambulatory Visit: Payer: Commercial Managed Care - PPO | Admitting: Endocrinology

## 2018-06-15 ENCOUNTER — Other Ambulatory Visit: Payer: Self-pay

## 2018-06-16 ENCOUNTER — Other Ambulatory Visit: Payer: Self-pay

## 2018-06-16 ENCOUNTER — Ambulatory Visit: Payer: Commercial Managed Care - PPO | Admitting: Endocrinology

## 2018-06-16 ENCOUNTER — Telehealth: Payer: Self-pay | Admitting: Endocrinology

## 2018-06-16 ENCOUNTER — Encounter: Payer: Self-pay | Admitting: Endocrinology

## 2018-06-16 VITALS — BP 140/90 | HR 94 | Temp 98.2°F | Ht 70.0 in | Wt 234.8 lb

## 2018-06-16 DIAGNOSIS — R197 Diarrhea, unspecified: Secondary | ICD-10-CM

## 2018-06-16 DIAGNOSIS — E119 Type 2 diabetes mellitus without complications: Secondary | ICD-10-CM

## 2018-06-16 LAB — POCT GLYCOSYLATED HEMOGLOBIN (HGB A1C)
HbA1c POC (<> result, manual entry): 8.8 % (ref 4.0–5.6)
Hemoglobin A1C: 8.8 % — AB (ref 4.0–5.6)

## 2018-06-16 MED ORDER — BROMOCRIPTINE MESYLATE 2.5 MG PO TABS: 2.5000 mg | ORAL_TABLET | Freq: Every day | ORAL | 11 refills | Status: DC

## 2018-06-16 MED ORDER — BROMOCRIPTINE MESYLATE 2.5 MG PO TABS
2.5000 mg | ORAL_TABLET | Freq: Every day | ORAL | 11 refills | Status: DC
Start: 2018-06-16 — End: 2019-04-14

## 2018-06-16 NOTE — Telephone Encounter (Signed)
°  bromocriptine (PARLODEL) 2.5 MG tablet   Patient stated that this medication was sent to the wrong pharmacy and will need this sent to the pharmacy listed below   [facility], [address] AT [place]

## 2018-06-16 NOTE — Patient Instructions (Addendum)
Your blood pressure is high today.  Please see your primary care provider soon, to have it rechecked check your blood sugar once a day.  vary the time of day when you check, between before the 3 meals, and at bedtime.  also check if you have symptoms of your blood sugar being too high or too low.  please keep a record of the readings and bring it to your next appointment here (or you can bring the meter itself).  You can write it on any piece of paper.  please call us sooner if your blood sugar goes below 70, or if you have a lot of readings over 200. I have sent a prescription to your pharmacy, to add "bromocriptine."   If necessary, we can also add acarbose, metformin-XR.   Please come back for a follow-up appointment in 2 months.

## 2018-06-16 NOTE — Progress Notes (Signed)
Subjective:    Patient ID: Steven Mays, male    DOB: [birth year], 58 y.o.   MRN: [id number]  HPI Pt returns for f/u of diabetes mellitus: DM type: 2 Dx'ed: 7510 Complications: none Therapy: trulicity and 3 oral meds DKA: never Severe hypoglycemia: never Pancreatitis: never Pancreatic imaging: never Other: he has never been on insulin; he did not tolerate metformin (diarrhea) Interval history: no cbg record, but states cbg's vary from 90-165.  pt states he feels well in general.   Past Medical History:  Diagnosis Date  . Anxiety   . Depression   . Diabetes mellitus without complication ([place])   . Essential tremor   . Sleep apnea    cannot tolerate cpap    Past Surgical History:  Procedure Laterality Date  . lumbar lamincectomy  2008  . TONSILLECTOMY  1981    Social History   Socioeconomic History  . Marital status: Married    Spouse name: Not on file  . Number of children: Not on file  . Years of education: Not on file  . Highest education level: Not on file  Occupational History  . Not on file  Social Needs  . Financial resource strain: Not on file  . Food insecurity:    Worry: Not on file    Inability: Not on file  . Transportation needs:    Medical: Not on file    Non-medical: Not on file  Tobacco Use  . Smoking status: Former Smoker    Last attempt to quit: 12/28/2006    Years since quitting: 11.4  . Smokeless tobacco: Never Used  Substance and Sexual Activity  . Alcohol use: Yes    Alcohol/week: 2.0 standard drinks    Types: 2 Cans of beer per week  . Drug use: No  . Sexual activity: Not on file  Lifestyle  . Physical activity:    Days per week: Not on file    Minutes per session: Not on file  . Stress: Not on file  Relationships  . Social connections:    Talks on phone: Not on file    Gets together: Not on file    Attends religious service: Not on file    Active member of club or organization: Not on file    Attends meetings of clubs or  organizations: Not on file    Relationship status: Not on file  . Intimate partner violence:    Fear of current or ex partner: Not on file    Emotionally abused: Not on file    Physically abused: Not on file    Forced sexual activity: Not on file  Other Topics Concern  . Not on file  Social History Narrative  . Not on file    Current Outpatient Medications on File Prior to Visit  Medication Sig Dispense Refill  . ALPRAZolam (XANAX) 0.5 MG tablet Take 0.5 mg by mouth as needed.     Marland Kitchen aspirin 81 MG tablet Take 81 mg by mouth daily.    Marland Kitchen buPROPion (WELLBUTRIN XL) 300 MG 24 hr tablet Take 300 mg by mouth daily.    . busPIRone (BUSPAR) 15 MG tablet Take 15 mg by mouth daily.    . clindamycin (CLEOCIN) 300 MG capsule Take 1 capsule (300 mg total) by mouth 4 (four) times daily. (Patient not taking: Reported on 03/22/2018) 28 capsule 0  . colesevelam (WELCHOL) 625 MG tablet Take 3 tablets (1,875 mg total) by mouth daily. 90 tablet 11  .  Dulaglutide (TRULICITY) 1.5 PN/3.6RW SOPN Inject into the skin.    Marland Kitchen empagliflozin (JARDIANCE) 25 MG TABS tablet Take 25 mg by mouth daily.    Marland Kitchen glimepiride (AMARYL) 2 MG tablet Take 2 mg by mouth daily with breakfast.    . glucose blood (ACCU-CHEK GUIDE) test strip 1 each by Other route daily. And lancets 1/day 100 each 3  . HYDROcodone-acetaminophen (NORCO/VICODIN) 5-325 MG per tablet Take 2 tablets by mouth every 6 (six) hours as needed. (Patient not taking: Reported on 03/22/2018) 30 tablet 0  . Multiple Vitamin (MULTIVITAMIN) tablet Take 1 tablet by mouth daily.    . propranolol (INNOPRAN XL) 80 MG 24 hr capsule Take 80 mg by mouth daily.    Marland Kitchen testosterone enanthate (DELATESTRYL) 200 MG/ML injection Inject into the muscle every 14 (fourteen) days. For IM use only    . traZODone (DESYREL) 100 MG tablet Take 100 mg by mouth at bedtime.     No current facility-administered medications on file prior to visit.     Allergies  Allergen Reactions  .  Amoxicillin-Pot Clavulanate   . Clindamycin   . Effexor Xr [Venlafaxine Hcl Er] Other (See Comments)  . Metformin And Related Diarrhea  . Nefazodone Other (See Comments)    hallucinations  . Tetracyclines & Related Nausea And Vomiting    Family History  Problem Relation Age of Onset  . Hypertension Other   . Cancer Other   . Colon cancer Neg Hx   . Stomach cancer Neg Hx   . Diabetes Neg Hx     BP 140/90   Pulse 94   Temp 98.2 F (36.8 C) (Oral)   Ht 5\' 10"  (1.778 m)   Wt 234 lb 12.8 oz (106.5 kg)   SpO2 94%   BMI 33.69 kg/m   Review of Systems He denies hypoglycemia    Objective:   Physical Exam VITAL SIGNS:  See vs page GENERAL: no distress Pulses: dorsalis pedis intact bilat.   MSK: no deformity of the feet CV: no leg edema, but there are bilat vv's of the ankles.   Skin:  no ulcer on the feet.  normal color and temp on the feet. Neuro: sensation is intact to touch on the feet   A1c=8.8%    Assessment & Plan:  HTN: is noted today. Type 2 DM: he needs increased rx. Diarrhea: we discussed.  As this was non non-ER metformin, we could re-try a low dosage of -XR.  VV's: in this setting, I hesitate to use pioglitazone.  However, we can re-visit this if necessary.  Patient Instructions  Your blood pressure is high today.  Please see your primary care provider soon, to have it rechecked check your blood sugar once a day.  vary the time of day when you check, between before the 3 meals, and at bedtime.  also check if you have symptoms of your blood sugar being too high or too low.  please keep a record of the readings and bring it to your next appointment here (or you can bring the meter itself).  You can write it on any piece of paper.  please call us sooner if your blood sugar goes below 70, or if you have a lot of readings over 200. I have sent a prescription to your pharmacy, to add "bromocriptine."   If necessary, we can also add acarbose, metformin-XR.   Please  come back for a follow-up appointment in 2 months.

## 2018-06-16 NOTE — Telephone Encounter (Signed)
rx was cancelled at other pharmacy, new rx sent to requested pharmacy

## 2018-08-16 ENCOUNTER — Ambulatory Visit: Payer: Commercial Managed Care - PPO | Admitting: Endocrinology

## 2018-08-16 ENCOUNTER — Encounter: Payer: Self-pay | Admitting: Endocrinology

## 2018-08-17 ENCOUNTER — Other Ambulatory Visit: Payer: Self-pay

## 2018-08-17 ENCOUNTER — Ambulatory Visit (INDEPENDENT_AMBULATORY_CARE_PROVIDER_SITE_OTHER): Payer: Commercial Managed Care - PPO | Admitting: Endocrinology

## 2018-08-17 DIAGNOSIS — E119 Type 2 diabetes mellitus without complications: Secondary | ICD-10-CM | POA: Diagnosis not present

## 2018-08-17 NOTE — Progress Notes (Signed)
Subjective:    Patient ID: Steven Mays, male    DOB: Jan 17, 1961, 58 y.o.   MRN: [id number]  HPI  telehealth visit today via doxy video visit.  Alternatives to telehealth are presented to this patient, and the patient agrees to the telehealth visit. Pt is advised of the cost of the visit, and agrees to this, also.   Patient is at home, and I am at the office.   Persons attending the telehealth visit: the patient and I Pt returns for f/u of diabetes mellitus: DM type: 2 Dx'ed: 3295 Complications: none Therapy: trulicity and 4 oral meds DKA: never Severe hypoglycemia: never Pancreatitis: never Pancreatic imaging: never Other: he has never been on insulin; he did not tolerate metformin (diarrhea).   Interval history: no cbg record, but states cbg's vary from 140-210.  pt states he feels well in general.  He takes welchol just 2/day.   Past Medical History:  Diagnosis Date  . Anxiety   . Depression   . Diabetes mellitus without complication ([place])   . Essential tremor   . Sleep apnea    cannot tolerate cpap    Past Surgical History:  Procedure Laterality Date  . lumbar lamincectomy  2008  . TONSILLECTOMY  1981    Social History   Socioeconomic History  . Marital status: Married    Spouse name: Not on file  . Number of children: Not on file  . Years of education: Not on file  . Highest education level: Not on file  Occupational History  . Not on file  Social Needs  . Financial resource strain: Not on file  . Food insecurity:    Worry: Not on file    Inability: Not on file  . Transportation needs:    Medical: Not on file    Non-medical: Not on file  Tobacco Use  . Smoking status: Former Smoker    Last attempt to quit: 12/28/2006    Years since quitting: 11.6  . Smokeless tobacco: Never Used  Substance and Sexual Activity  . Alcohol use: Yes    Alcohol/week: 2.0 standard drinks    Types: 2 Cans of beer per week  . Drug use: No  . Sexual activity: Not on  file  Lifestyle  . Physical activity:    Days per week: Not on file    Minutes per session: Not on file  . Stress: Not on file  Relationships  . Social connections:    Talks on phone: Not on file    Gets together: Not on file    Attends religious service: Not on file    Active member of club or organization: Not on file    Attends meetings of clubs or organizations: Not on file    Relationship status: Not on file  . Intimate partner violence:    Fear of current or ex partner: Not on file    Emotionally abused: Not on file    Physically abused: Not on file    Forced sexual activity: Not on file  Other Topics Concern  . Not on file  Social History Narrative  . Not on file    Current Outpatient Medications on File Prior to Visit  Medication Sig Dispense Refill  . ALPRAZolam (XANAX) 0.5 MG tablet Take 0.5 mg by mouth as needed.     Marland Kitchen aspirin 81 MG tablet Take 81 mg by mouth daily.    . bromocriptine (PARLODEL) 2.5 MG tablet Take 1 tablet (2.5 mg  total) by mouth daily. 30 tablet 11  . buPROPion (WELLBUTRIN XL) 300 MG 24 hr tablet Take 300 mg by mouth daily.    . busPIRone (BUSPAR) 15 MG tablet Take 15 mg by mouth daily.    . clindamycin (CLEOCIN) 300 MG capsule Take 1 capsule (300 mg total) by mouth 4 (four) times daily. 28 capsule 0  . colesevelam (WELCHOL) 625 MG tablet Take 3 tablets (1,875 mg total) by mouth daily. 90 tablet 11  . Dulaglutide (TRULICITY) 1.5 WG/9.5AO SOPN Inject into the skin.    Marland Kitchen empagliflozin (JARDIANCE) 25 MG TABS tablet Take 25 mg by mouth daily.    Marland Kitchen glimepiride (AMARYL) 2 MG tablet Take 2 mg by mouth daily with breakfast.    . glucose blood (ACCU-CHEK GUIDE) test strip 1 each by Other route daily. And lancets 1/day 100 each 3  . HYDROcodone-acetaminophen (NORCO/VICODIN) 5-325 MG per tablet Take 2 tablets by mouth every 6 (six) hours as needed. 30 tablet 0  . Multiple Vitamin (MULTIVITAMIN) tablet Take 1 tablet by mouth daily.    . propranolol (INNOPRAN  XL) 80 MG 24 hr capsule Take 80 mg by mouth daily.    Marland Kitchen testosterone enanthate (DELATESTRYL) 200 MG/ML injection Inject into the muscle every 14 (fourteen) days. For IM use only    . traZODone (DESYREL) 100 MG tablet Take 100 mg by mouth at bedtime.     No current facility-administered medications on file prior to visit.     Allergies  Allergen Reactions  . Amoxicillin-Pot Clavulanate   . Clindamycin   . Effexor Xr [Venlafaxine Hcl Er] Other (See Comments)  . Metformin And Related Diarrhea  . Nefazodone Other (See Comments)    hallucinations  . Tetracyclines & Related Nausea And Vomiting    Family History  Problem Relation Age of Onset  . Hypertension Other   . Cancer Other   . Colon cancer Neg Hx   . Stomach cancer Neg Hx   . Diabetes Neg Hx      Review of Systems He denies hypoglycemia and nausea.      Objective:   Physical Exam    Lab Results  Component Value Date   HGBA1C 8.8 (A) 06/16/2018   HGBA1C 8.8 06/16/2018   Lab Results  Component Value Date   CREATININE 1.20 02/04/2013   BUN 23 02/04/2013   NA 129 (L) 02/04/2013   K 3.9 02/04/2013   CL 92 (L) 02/04/2013   CO2 24 02/04/2013       Assessment & Plan:  Type 2 DM: he needs increased rx.    Patient Instructions  check your blood sugar once a day.  vary the time of day when you check, between before the 3 meals, and at bedtime.  also check if you have symptoms of your blood sugar being too high or too low.  please keep a record of the readings and bring it to your next appointment here (or you can bring the meter itself).  You can write it on any piece of paper.  please call us sooner if your blood sugar goes below 70, or if you have a lot of readings over 200. Please increase the welchol to 3 pills per day.   If necessary, we can also add acarbose, or metformin-XR.   Please come back for a follow-up appointment in 2 months.

## 2018-08-17 NOTE — Patient Instructions (Addendum)
check your blood sugar once a day.  vary the time of day when you check, between before the 3 meals, and at bedtime.  also check if you have symptoms of your blood sugar being too high or too low.  please keep a record of the readings and bring it to your next appointment here (or you can bring the meter itself).  You can write it on any piece of paper.  please call us sooner if your blood sugar goes below 70, or if you have a lot of readings over 200. Please increase the welchol to 3 pills per day.   If necessary, we can also add acarbose, or metformin-XR.   Please come back for a follow-up appointment in 2 months.

## 2018-11-17 IMAGING — MR MR CERVICAL SPINE W/O CM
4 of 5 series · 27 of 48 positions shown · non-contrast
Comparison: None.

CLINICAL DATA: Left arm pain and numbness.

EXAM:
MRI CERVICAL SPINE WITHOUT CONTRAST
TECHNIQUE: Multiplanar, multisequence MR imaging of the cervical spine was
performed. No intravenous contrast was administered.

[Series 5: T1 · sagittal · 3.0mm · 0.66mm/px · 6 of 15 slices shown]
[im 1/15]
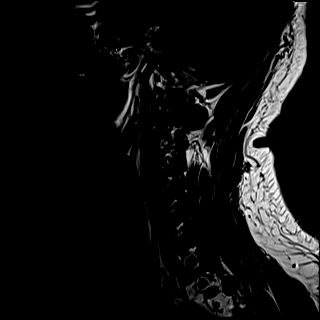
[im 3/15]
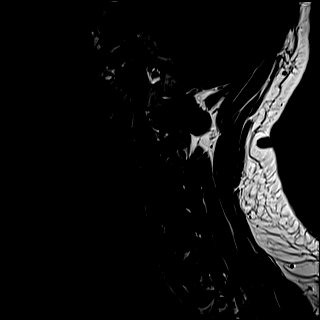
[im 6/15]
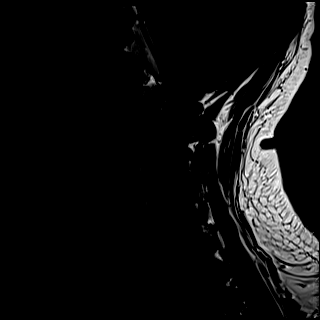
[im 9/15]
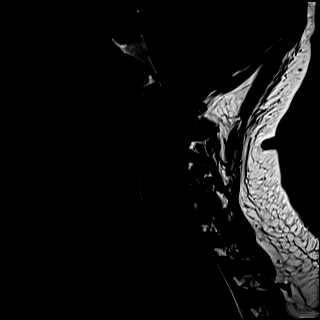
[im 12/15]
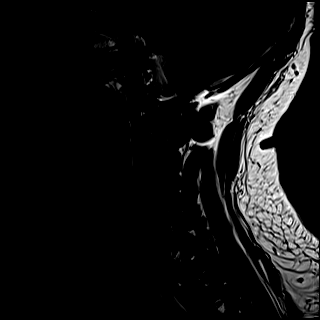
[im 15/15]
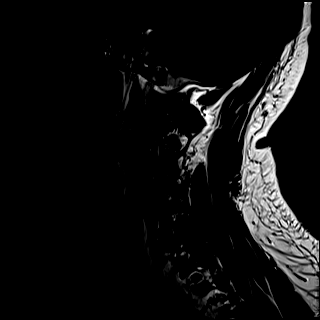

[Series 6: T2 · sagittal · 3.0mm · 0.55mm/px · 7 of 15 slices shown (1 of 2)]
[im 1/15]
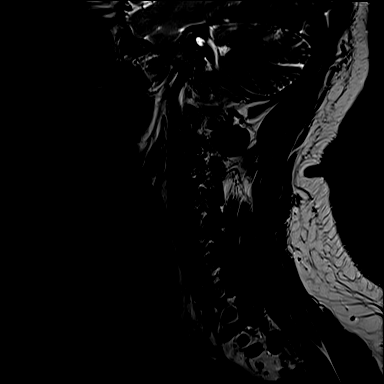
[im 3/15]
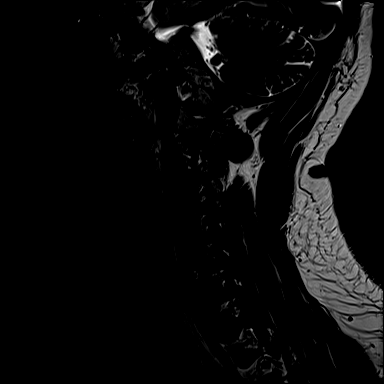
[im 5/15]
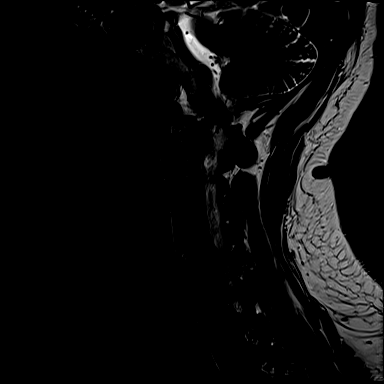
[im 8/15]
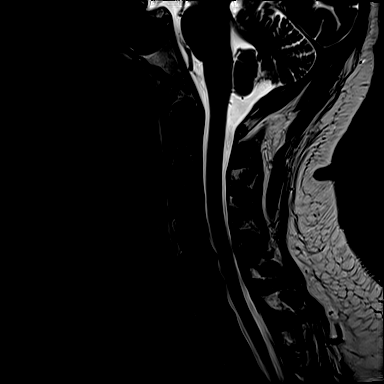
[im 10/15]
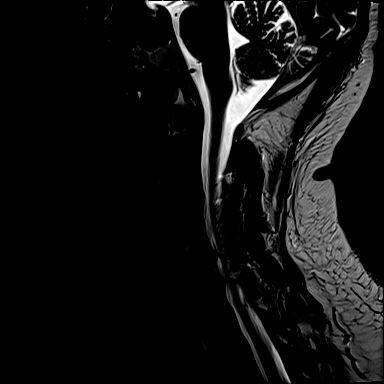
[im 12/15]
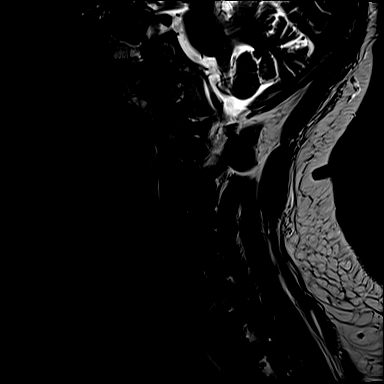
[im 15/15]
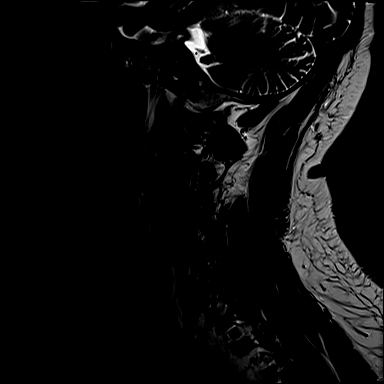

[Series 7: STIR · sagittal · 3.0mm · 0.33mm/px · 6 of 15 slices shown]
[im 1/15]
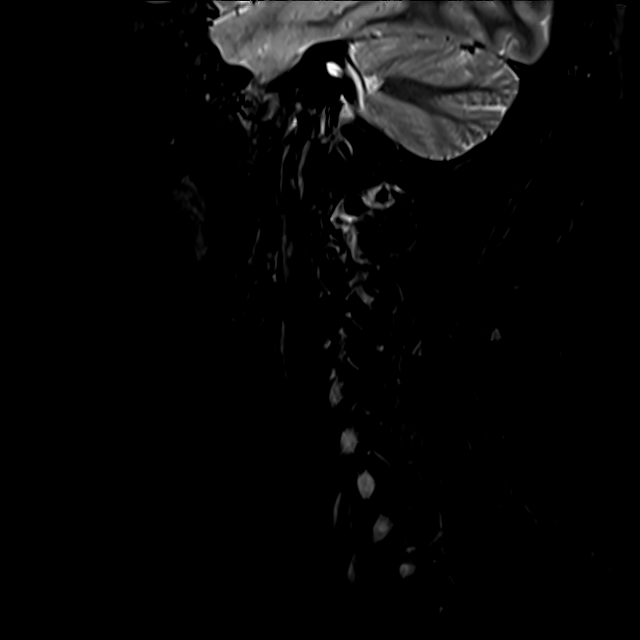
[im 3/15]
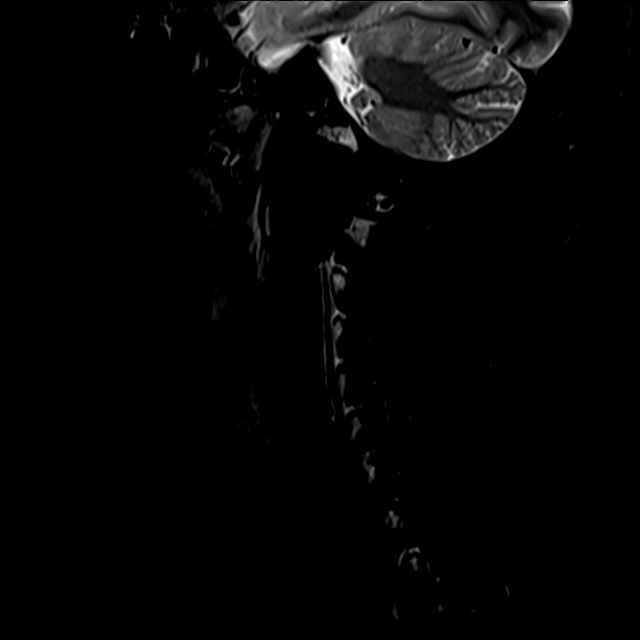
[im 5/15]
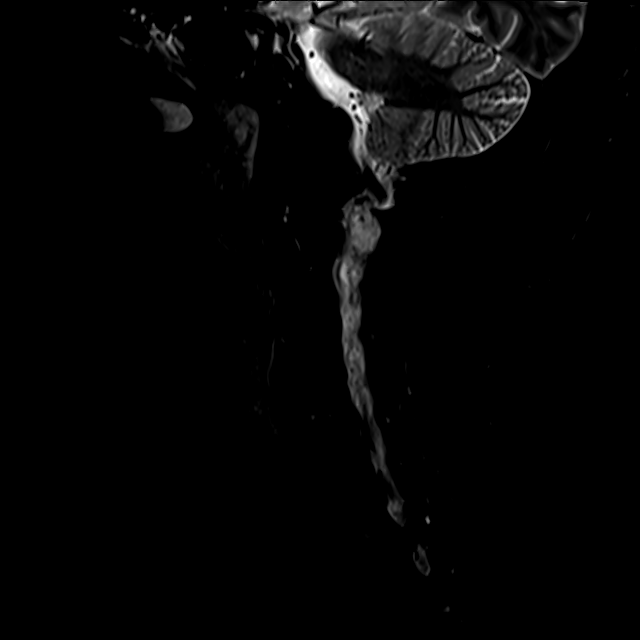
[im 8/15]
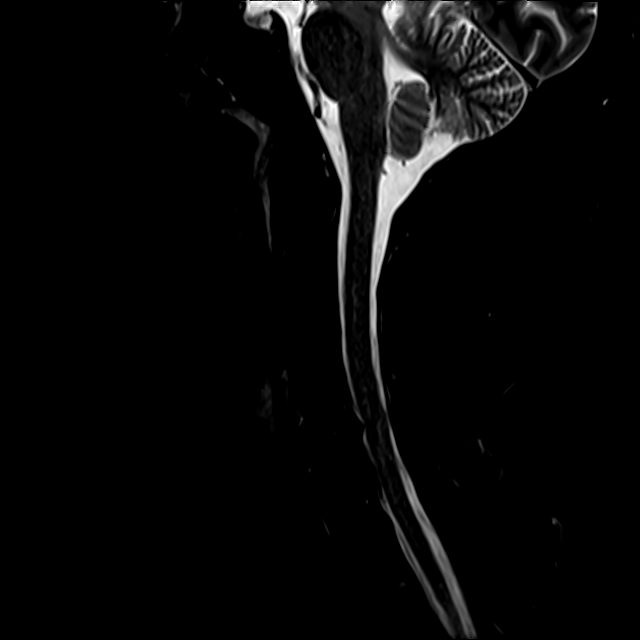
[im 10/15]
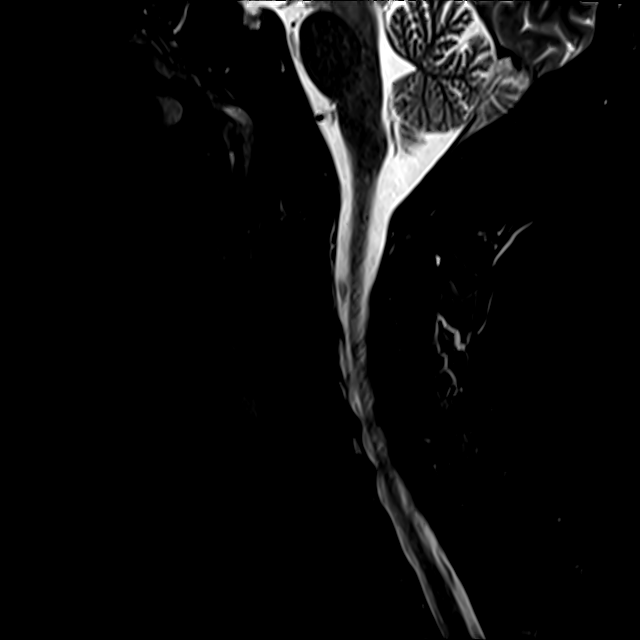
[im 12/15]
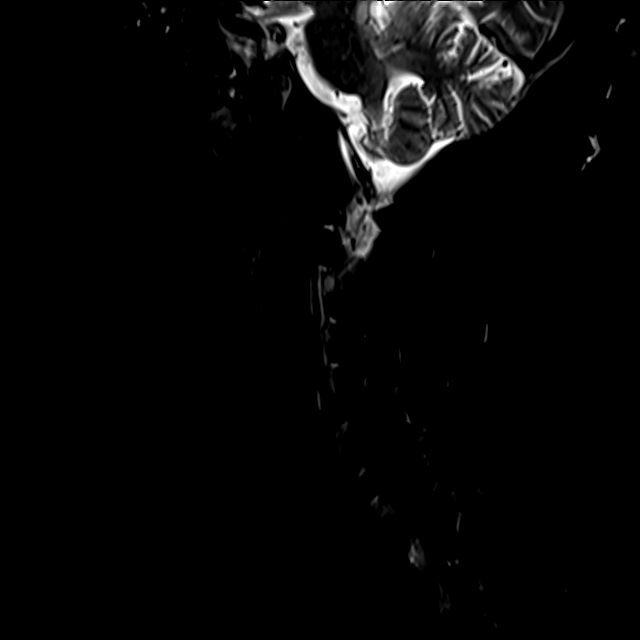

[Series 8: T2 · axial · 3.0mm · 0.50mm/px · z∈[-42,+58]mm · 8 of 32 slices shown (2 of 2)]
[im 1/32]
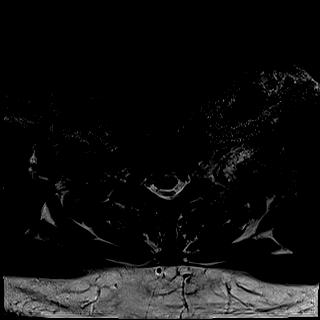
[im 5/32]
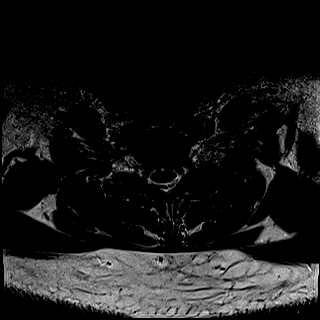
[im 10/32]
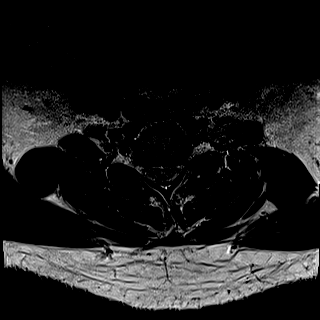
[im 15/32]
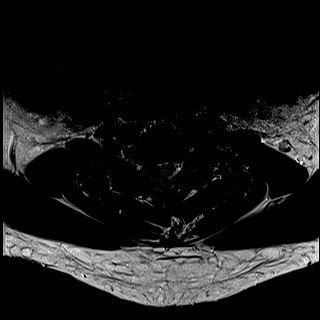
[im 17/32]
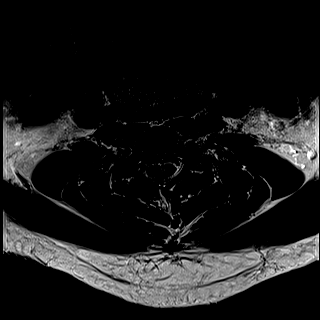
[im 22/32]
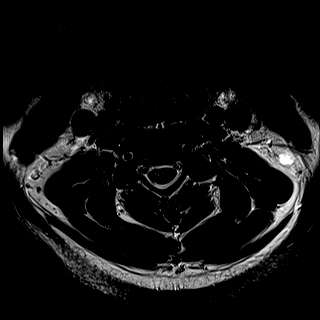
[im 27/32]
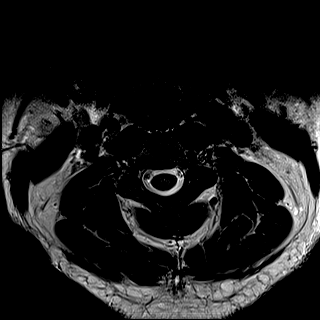
[im 32/32]
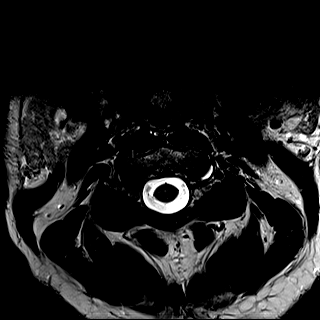

[27 of 48 positions shown; findings below may reference images not displayed]

FINDINGS: Alignment: Physiologic.

Vertebrae: No fracture, evidence of discitis, or bone lesion.

Cord: Normal signal and morphology.

Posterior Fossa, vertebral arteries, paraspinal tissues: Posterior
fossa demonstrates no focal abnormality. Vertebral artery flow voids
are maintained. Paraspinal soft tissues are unremarkable.

Disc levels:

Discs: Degenerative disc disease with disc height loss at C5-6 and
to lesser extent C6-7.

C2-3: No significant disc bulge. No neural foraminal stenosis. No
central canal stenosis.

C3-4: No significant disc bulge. Mild left facet arthropathy. Mild
left foraminal narrowing. No central canal stenosis.

C4-5: No significant disc bulge. No neural foraminal stenosis. No
central canal stenosis.

C5-6: Broad-based disc bulge. Bilateral uncovertebral degenerative
changes. Mild right and moderate left foraminal stenosis. No central
canal stenosis.

C6-7: Broad left paracentral disc protrusion. Moderate-severe left
foraminal stenosis. Mild right foraminal stenosis. No central canal
stenosis.

C7-T1: No significant disc bulge. No neural foraminal stenosis. No
central canal stenosis.
IMPRESSION: 1. At C5-6 there is a broad based disc bulge. Bilateral
uncovertebral degenerative changes. Mild right and moderate left
foraminal stenosis.
2. At C6-7 there is a broad left paracentral disc protrusion.
Moderate-severe left foraminal stenosis. Mild right foraminal
stenosis.

## 2019-01-31 ENCOUNTER — Telehealth: Payer: Self-pay | Admitting: Endocrinology

## 2019-01-31 NOTE — Telephone Encounter (Signed)
please contact patient: Ov is due

## 2019-01-31 NOTE — Telephone Encounter (Signed)
Routing this message to the front desk for scheduling purposes. 

## 2019-02-03 NOTE — Telephone Encounter (Signed)
LMTCB to schedule appointment °

## 2019-02-15 NOTE — Telephone Encounter (Signed)
LMTCB x 2 to schedule appointment °

## 2019-02-21 ENCOUNTER — Encounter: Payer: Self-pay | Admitting: Endocrinology

## 2019-02-21 NOTE — Telephone Encounter (Signed)
LMTCB x 3. Mailed Letter.

## 2019-04-14 ENCOUNTER — Encounter: Payer: Self-pay | Admitting: Endocrinology

## 2019-04-14 ENCOUNTER — Ambulatory Visit: Payer: Commercial Managed Care - PPO | Admitting: Endocrinology

## 2019-04-14 ENCOUNTER — Other Ambulatory Visit: Payer: Self-pay

## 2019-04-14 VITALS — BP 142/80 | HR 107 | Ht 70.0 in | Wt 239.0 lb

## 2019-04-14 DIAGNOSIS — E119 Type 2 diabetes mellitus without complications: Secondary | ICD-10-CM

## 2019-04-14 LAB — POCT GLYCOSYLATED HEMOGLOBIN (HGB A1C): Hemoglobin A1C: 9.8 % — AB (ref 4.0–5.6)

## 2019-04-14 MED ORDER — BROMOCRIPTINE MESYLATE 5 MG PO CAPS: 5.0000 mg | ORAL_CAPSULE | Freq: Every day | ORAL | 3 refills | Status: DC

## 2019-04-14 MED ORDER — GLIMEPIRIDE 4 MG PO TABS: 4.0000 mg | ORAL_TABLET | Freq: Every day | ORAL | 3 refills | Status: DC

## 2019-04-14 MED ORDER — TRULICITY 3 MG/0.5ML ~~LOC~~ SOAJ: 3.0000 mg | SUBCUTANEOUS | 3 refills | Status: DC

## 2019-04-14 MED ORDER — COLESEVELAM HCL 625 MG PO TABS: 2500.0000 mg | ORAL_TABLET | Freq: Every day | ORAL | 3 refills | Status: DC

## 2019-04-14 NOTE — Patient Instructions (Addendum)
Your blood pressure is high today.  Please see your primary care provider soon, to have it rechecked check your blood sugar once a day.  vary the time of day when you check, between before the 3 meals, and at bedtime.  also check if you have symptoms of your blood sugar being too high or too low.  please keep a record of the readings and bring it to your next appointment here (or you can bring the meter itself).  You can write it on any piece of paper.  please call us sooner if your blood sugar goes below 70, or if you have a lot of readings over 200. Please increase the welchol, trulicity, glimepiride, and bromocriptine. Please continue the same other diabetes medications.   We can add acarbose also if necessary.  Please come back for a follow-up appointment in 2 months .

## 2019-04-14 NOTE — Progress Notes (Signed)
Subjective:    Patient ID: Steven Mays, male    DOB: 10-04-1960, 59 y.o.   MRN: [id number]  HPI Pt returns for f/u of diabetes mellitus: DM type: 2 Dx'ed: [id number] Complications: renal insuff Therapy: trulicity and 4 oral meds DKA: never Severe hypoglycemia: never Pancreatitis: never Pancreatic imaging: never Other: he has never been on insulin; he did not tolerate metformin (diarrhea); edema limits rx options.   Interval history: no cbg record, but states cbg's vary from 120-210.  pt states he feels well in general.  He takes meds as rx'ed.   Past Medical History:  Diagnosis Date  . Anxiety   . Depression   . Diabetes mellitus without complication ([place])   . Essential tremor   . Sleep apnea    cannot tolerate cpap    Past Surgical History:  Procedure Laterality Date  . lumbar lamincectomy  2008  . TONSILLECTOMY  1981    Social History   Socioeconomic History  . Marital status: Married    Spouse name: Not on file  . Number of children: Not on file  . Years of education: Not on file  . Highest education level: Not on file  Occupational History  . Not on file  Tobacco Use  . Smoking status: Former Smoker    Quit date: 12/28/2006    Years since quitting: 12.3  . Smokeless tobacco: Never Used  Substance and Sexual Activity  . Alcohol use: Yes    Alcohol/week: 2.0 standard drinks    Types: 2 Cans of beer per week  . Drug use: No  . Sexual activity: Not on file  Other Topics Concern  . Not on file  Social History Narrative  . Not on file   Social Determinants of Health   Financial Resource Strain:   . Difficulty of Paying Living Expenses: Not on file  Food Insecurity:   . Worried About Charity fundraiser in the Last Year: Not on file  . Ran Out of Food in the Last Year: Not on file  Transportation Needs:   . Lack of Transportation (Medical): Not on file  . Lack of Transportation (Non-Medical): Not on file  Physical Activity:   . Days of Exercise per  Week: Not on file  . Minutes of Exercise per Session: Not on file  Stress:   . Feeling of Stress : Not on file  Social Connections:   . Frequency of Communication with Friends and Family: Not on file  . Frequency of Social Gatherings with Friends and Family: Not on file  . Attends Religious Services: Not on file  . Active Member of Clubs or Organizations: Not on file  . Attends Archivist Meetings: Not on file  . Marital Status: Not on file  Intimate Partner Violence:   . Fear of Current or Ex-Partner: Not on file  . Emotionally Abused: Not on file  . Physically Abused: Not on file  . Sexually Abused: Not on file    Current Outpatient Medications on File Prior to Visit  Medication Sig Dispense Refill  . ALPRAZolam (XANAX) 0.5 MG tablet Take 0.5 mg by mouth as needed.     Marland Kitchen aspirin 81 MG tablet Take 81 mg by mouth daily.    Marland Kitchen buPROPion (WELLBUTRIN XL) 300 MG 24 hr tablet Take 300 mg by mouth daily.    . busPIRone (BUSPAR) 15 MG tablet Take 15 mg by mouth daily.    . clindamycin (CLEOCIN) 300 MG capsule Take  1 capsule (300 mg total) by mouth 4 (four) times daily. 28 capsule 0  . empagliflozin (JARDIANCE) 25 MG TABS tablet Take 25 mg by mouth daily.    Marland Kitchen glucose blood (ACCU-CHEK GUIDE) test strip 1 each by Other route daily. And lancets 1/day 100 each 3  . HYDROcodone-acetaminophen (NORCO/VICODIN) 5-325 MG per tablet Take 2 tablets by mouth every 6 (six) hours as needed. 30 tablet 0  . Multiple Vitamin (MULTIVITAMIN) tablet Take 1 tablet by mouth daily.    . propranolol (INNOPRAN XL) 80 MG 24 hr capsule Take 80 mg by mouth daily.    Marland Kitchen testosterone enanthate (DELATESTRYL) 200 MG/ML injection Inject into the muscle every 14 (fourteen) days. For IM use only    . traZODone (DESYREL) 100 MG tablet Take 100 mg by mouth at bedtime.     No current facility-administered medications on file prior to visit.    Allergies  Allergen Reactions  . Amoxicillin-Pot Clavulanate   .  Clindamycin   . Effexor Xr [Venlafaxine Hcl Er] Other (See Comments)  . Metformin And Related Diarrhea  . Nefazodone Other (See Comments)    hallucinations  . Tetracyclines & Related Nausea And Vomiting    Family History  Problem Relation Age of Onset  . Hypertension Other   . Cancer Other   . Colon cancer Neg Hx   . Stomach cancer Neg Hx   . Diabetes Neg Hx     BP (!) 142/80 (BP Location: Right Arm, Patient Position: Sitting, Cuff Size: Large)   Pulse (!) 107   Ht 5\' 10"  (1.778 m)   Wt 239 lb (108.4 kg)   SpO2 95%   BMI 34.29 kg/m    Review of Systems He denies hypoglycemia.      Objective:   Physical Exam VITAL SIGNS:  See vs page GENERAL: no distress Pulses: dorsalis pedis intact bilat.   MSK: no deformity of the feet CV: trace bilat leg edema, and bilat vv's Skin:  no ulcer on the feet.  normal color and temp on the feet. Neuro: sensation is intact to touch on the feet.   Lab Results  Component Value Date   HGBA1C 9.8 (A) 04/14/2019   Lab Results  Component Value Date   CREATININE 1.20 02/04/2013   BUN 23 02/04/2013   NA 129 (L) 02/04/2013   K 3.9 02/04/2013   CL 92 (L) 02/04/2013   CO2 24 02/04/2013      Assessment & Plan:  Type 2 DM: worse. He declines insulin. Renal insuff: This limits rx options Edema: This also limits rx options HTN: is noted today   Patient Instructions  Your blood pressure is high today.  Please see your primary care provider soon, to have it rechecked check your blood sugar once a day.  vary the time of day when you check, between before the 3 meals, and at bedtime.  also check if you have symptoms of your blood sugar being too high or too low.  please keep a record of the readings and bring it to your next appointment here (or you can bring the meter itself).  You can write it on any piece of paper.  please call us sooner if your blood sugar goes below 70, or if you have a lot of readings over 200. Please increase the  welchol, trulicity, glimepiride, and bromocriptine. Please continue the same other diabetes medications.   We can add acarbose also if necessary.  Please come back for a follow-up appointment  in 2 months .

## 2019-05-19 ENCOUNTER — Telehealth: Payer: Self-pay | Admitting: Endocrinology

## 2019-05-19 ENCOUNTER — Other Ambulatory Visit: Payer: Self-pay

## 2019-05-19 DIAGNOSIS — E119 Type 2 diabetes mellitus without complications: Secondary | ICD-10-CM

## 2019-05-19 MED ORDER — TRULICITY 3 MG/0.5ML ~~LOC~~ SOAJ
3.0000 mg | SUBCUTANEOUS | 3 refills | Status: DC
Start: 2019-05-19 — End: 2020-07-22

## 2019-05-19 NOTE — Telephone Encounter (Signed)
Preferred Pharmacies      [facility], [address] AT [address] [place]    Outpatient Medication Detail   Disp Refills Start End   Dulaglutide (TRULICITY) 3 0000000 SOPN 12 pen 3 05/19/2019    Sig - Route: Inject 3 mg into the skin once a week. - Subcutaneous   Sent to pharmacy as: Dulaglutide (TRULICITY) 3 0000000 Solution Pen-injector   E-Prescribing Status: Receipt confirmed by pharmacy (05/19/2019  2:55 PM EST)    As indicated above, this is the only preferred pharmacy listed for pt unless pt chooses an alternate pharmacy for Rx's to be sent.

## 2019-05-19 NOTE — Telephone Encounter (Signed)
Patient has requested that [facility] on [address] ([address] of [address]) be his only pharmacy on file.  MEDICATION: Trulicity  PHARMACY:   [facility] [address]   IS THIS A 90 DAY SUPPLY :   IS PATIENT OUT OF MEDICATION: yes  IF NOT; HOW MUCH IS LEFT:   LAST APPOINTMENT DATE: @1 /29/2021  NEXT APPOINTMENT DATE:@4 /02/2020  DO WE HAVE YOUR PERMISSION TO LEAVE A DETAILED MESSAGE:  OTHER COMMENTS: per patient Trulity was never sent to any pharmacy   **Let patient know to contact pharmacy at the end of the day to make sure medication is ready. **  ** Please notify patient to allow 48-72 hours to process**  **Encourage patient to contact the pharmacy for refills or they can request refills through [facility]**

## 2019-06-04 ENCOUNTER — Other Ambulatory Visit: Payer: Self-pay | Admitting: Endocrinology

## 2019-06-26 ENCOUNTER — Ambulatory Visit: Payer: Commercial Managed Care - PPO | Admitting: Endocrinology

## 2019-09-15 ENCOUNTER — Other Ambulatory Visit: Payer: Self-pay | Admitting: Endocrinology

## 2019-09-16 NOTE — Telephone Encounter (Signed)
1.  Please schedule f/u appt 2.  Then please refill x 1, pending that appt.  

## 2020-04-16 DIAGNOSIS — E119 Type 2 diabetes mellitus without complications: Secondary | ICD-10-CM | POA: Diagnosis not present

## 2020-04-16 DIAGNOSIS — K219 Gastro-esophageal reflux disease without esophagitis: Secondary | ICD-10-CM | POA: Diagnosis not present

## 2020-04-16 DIAGNOSIS — G479 Sleep disorder, unspecified: Secondary | ICD-10-CM | POA: Diagnosis not present

## 2020-04-16 DIAGNOSIS — G250 Essential tremor: Secondary | ICD-10-CM | POA: Diagnosis not present

## 2020-04-16 DIAGNOSIS — E291 Testicular hypofunction: Secondary | ICD-10-CM | POA: Diagnosis not present

## 2020-04-16 DIAGNOSIS — I10 Essential (primary) hypertension: Secondary | ICD-10-CM | POA: Diagnosis not present

## 2020-04-16 DIAGNOSIS — R69 Illness, unspecified: Secondary | ICD-10-CM | POA: Diagnosis not present

## 2020-04-17 ENCOUNTER — Telehealth: Payer: Self-pay | Admitting: Endocrinology

## 2020-04-17 NOTE — Telephone Encounter (Signed)
Pt have an appointment 05/20/20.

## 2020-04-17 NOTE — Telephone Encounter (Signed)
please contact patient: F/u ov is due 

## 2020-04-24 ENCOUNTER — Ambulatory Visit: Payer: Commercial Managed Care - PPO | Admitting: Endocrinology

## 2020-05-16 ENCOUNTER — Other Ambulatory Visit: Payer: Self-pay

## 2020-05-20 ENCOUNTER — Other Ambulatory Visit: Payer: 59

## 2020-05-20 ENCOUNTER — Other Ambulatory Visit: Payer: Self-pay

## 2020-05-20 ENCOUNTER — Ambulatory Visit: Payer: 59 | Admitting: Endocrinology

## 2020-05-20 VITALS — BP 146/90 | HR 98 | Ht 70.0 in | Wt 206.2 lb

## 2020-05-20 DIAGNOSIS — E119 Type 2 diabetes mellitus without complications: Secondary | ICD-10-CM | POA: Diagnosis not present

## 2020-05-20 LAB — POCT GLYCOSYLATED HEMOGLOBIN (HGB A1C): Hemoglobin A1C: 11.5 % — AB (ref 4.0–5.6)

## 2020-05-20 MED ORDER — ACCU-CHEK GUIDE VI STRP: 1.0000 | ORAL_STRIP | Freq: Every day | 3 refills | Status: AC

## 2020-05-20 NOTE — Patient Instructions (Addendum)
Your blood pressure is high today.  Please see your primary care provider soon, to have it rechecked check your blood sugar once a day.  vary the time of day when you check, between before the 3 meals, and at bedtime.  also check if you have symptoms of your blood sugar being too high or too low.  please keep a record of the readings and bring it to your next appointment here (or you can bring the meter itself).  You can write it on any piece of paper.  please call us sooner if your blood sugar goes below 70, or if you have a lot of readings over 200. Please continue the same diabetes medications.   We can add acarbose also if necessary.  Here is a new meter.  I have sent a prescription to your pharmacy, for strips. Blood tests are requested for you today.  We'll let you know about the results.  Please come back for a follow-up appointment in 2 months .

## 2020-05-20 NOTE — Progress Notes (Signed)
Subjective:    Patient ID: Steven Mays, male    DOB: Jan 14, 1961, 60 y.o.   MRN: [id number]  HPI Pt returns for f/u of diabetes mellitus: DM type: 2 Dx'ed: 8295 Complications: renal insuff Therapy: Trulicity and 4 oral meds DKA: never Severe hypoglycemia: never Pancreatitis: never Pancreatic imaging: never Other: he has never been on insulin; he did not tolerate metformin (diarrhea); edema limits rx options.   Interval history: no cbg record, but states cbg's vary from 100-200.  pt states he feels well in general.  He takes meds as rx'ed.  Pt says he sometimes misses meds.   Past Medical History:  Diagnosis Date  . Anxiety   . Depression   . Diabetes mellitus without complication ([place])   . Essential tremor   . Sleep apnea    cannot tolerate cpap    Past Surgical History:  Procedure Laterality Date  . lumbar lamincectomy  2008  . TONSILLECTOMY  1981    Social History   Socioeconomic History  . Marital status: Married    Spouse name: Not on file  . Number of children: Not on file  . Years of education: Not on file  . Highest education level: Not on file  Occupational History  . Not on file  Tobacco Use  . Smoking status: Former Smoker    Quit date: 12/28/2006    Years since quitting: 13.4  . Smokeless tobacco: Never Used  Substance and Sexual Activity  . Alcohol use: Yes    Alcohol/week: 2.0 standard drinks    Types: 2 Cans of beer per week  . Drug use: No  . Sexual activity: Not on file  Other Topics Concern  . Not on file  Social History Narrative  . Not on file   Social Determinants of Health   Financial Resource Strain: Not on file  Food Insecurity: Not on file  Transportation Needs: Not on file  Physical Activity: Not on file  Stress: Not on file  Social Connections: Not on file  Intimate Partner Violence: Not on file    Current Outpatient Medications on File Prior to Visit  Medication Sig Dispense Refill  . ALPRAZolam (XANAX) 0.5 MG tablet  Take 0.5 mg by mouth as needed.     Marland Kitchen aspirin 81 MG tablet Take 81 mg by mouth daily.    . bromocriptine (PARLODEL) 5 MG capsule Take 1 capsule (5 mg total) by mouth daily. 90 capsule 3  . buPROPion (WELLBUTRIN XL) 300 MG 24 hr tablet Take 300 mg by mouth daily.    . busPIRone (BUSPAR) 15 MG tablet Take 15 mg by mouth daily.    . clindamycin (CLEOCIN) 300 MG capsule Take 1 capsule (300 mg total) by mouth 4 (four) times daily. 28 capsule 0  . colesevelam (WELCHOL) 625 MG tablet Take 4 tablets (2,500 mg total) by mouth daily. 360 tablet 3  . Dulaglutide (TRULICITY) 3 AO/1.3YQ SOPN Inject 3 mg into the skin once a week. 12 pen 3  . empagliflozin (JARDIANCE) 25 MG TABS tablet Take 25 mg by mouth daily.    Marland Kitchen glimepiride (AMARYL) 4 MG tablet Take 1 tablet (4 mg total) by mouth daily before breakfast. 90 tablet 3  . HYDROcodone-acetaminophen (NORCO/VICODIN) 5-325 MG per tablet Take 2 tablets by mouth every 6 (six) hours as needed. 30 tablet 0  . Multiple Vitamin (MULTIVITAMIN) tablet Take 1 tablet by mouth daily.    . propranolol (INNOPRAN XL) 80 MG 24 hr capsule Take 80  mg by mouth daily.    Marland Kitchen testosterone enanthate (DELATESTRYL) 200 MG/ML injection Inject into the muscle every 14 (fourteen) days. For IM use only    . traZODone (DESYREL) 100 MG tablet Take 100 mg by mouth at bedtime.     No current facility-administered medications on file prior to visit.    Allergies  Allergen Reactions  . Amoxicillin-Pot Clavulanate   . Clindamycin   . Effexor Xr [Venlafaxine Hcl Er] Other (See Comments)  . Metformin And Related Diarrhea  . Nefazodone Other (See Comments)    hallucinations  . Tetracyclines & Related Nausea And Vomiting    Family History  Problem Relation Age of Onset  . Hypertension Other   . Cancer Other   . Colon cancer Neg Hx   . Stomach cancer Neg Hx   . Diabetes Neg Hx     BP (!) 146/90 (BP Location: Right Arm, Patient Position: Sitting, Cuff Size: Normal)   Pulse 98   Ht 5'  10" (1.778 m)   Wt 206 lb 3.2 oz (93.5 kg)   SpO2 95%   BMI 29.59 kg/m    Review of Systems He denies hypoglycemia.      Objective:   Physical Exam VITAL SIGNS:  See vs page.   GENERAL: no distress.   Pulses: dorsalis pedis intact bilat.   MSK: no deformity of the feet.   CV: no leg edema, but there are bilat vv's.  Skin:  no ulcer on the feet.  normal color and temp on the feet.   Neuro: sensation is intact to touch on the feet.   EXT: left great toenail is partially absent.     Lab Results  Component Value Date   HGBA1C 11.5 (A) 05/20/2020       Assessment & Plan:  Type 2 DM, uncontrolled.  A1c is much worse than cbg's would suggest.  Check fructosamine HTN: is noted today.    Patient Instructions  Your blood pressure is high today.  Please see your primary care provider soon, to have it rechecked check your blood sugar once a day.  vary the time of day when you check, between before the 3 meals, and at bedtime.  also check if you have symptoms of your blood sugar being too high or too low.  please keep a record of the readings and bring it to your next appointment here (or you can bring the meter itself).  You can write it on any piece of paper.  please call us sooner if your blood sugar goes below 70, or if you have a lot of readings over 200. Please continue the same diabetes medications.   We can add acarbose also if necessary.  Here is a new meter.  I have sent a prescription to your pharmacy, for strips. Blood tests are requested for you today.  We'll let you know about the results.  Please come back for a follow-up appointment in 2 months .

## 2020-05-23 LAB — FRUCTOSAMINE: Fructosamine: 392 umol/L — ABNORMAL HIGH (ref 205–285)

## 2020-05-24 DIAGNOSIS — H43823 Vitreomacular adhesion, bilateral: Secondary | ICD-10-CM | POA: Diagnosis not present

## 2020-05-24 DIAGNOSIS — E119 Type 2 diabetes mellitus without complications: Secondary | ICD-10-CM | POA: Diagnosis not present

## 2020-05-24 DIAGNOSIS — H33192 Other retinoschisis and retinal cysts, left eye: Secondary | ICD-10-CM | POA: Diagnosis not present

## 2020-05-24 DIAGNOSIS — H2513 Age-related nuclear cataract, bilateral: Secondary | ICD-10-CM | POA: Diagnosis not present

## 2020-06-05 ENCOUNTER — Other Ambulatory Visit: Payer: Self-pay | Admitting: Endocrinology

## 2020-06-05 NOTE — Telephone Encounter (Signed)
Ok to refill 

## 2020-07-22 ENCOUNTER — Other Ambulatory Visit: Payer: Self-pay

## 2020-07-22 ENCOUNTER — Ambulatory Visit: Payer: Managed Care, Other (non HMO) | Admitting: Endocrinology

## 2020-07-22 VITALS — BP 130/86 | HR 73 | Ht 70.0 in | Wt 213.0 lb

## 2020-07-22 DIAGNOSIS — E119 Type 2 diabetes mellitus without complications: Secondary | ICD-10-CM | POA: Diagnosis not present

## 2020-07-22 LAB — POCT GLYCOSYLATED HEMOGLOBIN (HGB A1C)

## 2020-07-22 MED ORDER — TRULICITY 4.5 MG/0.5ML ~~LOC~~ SOAJ: 4.5000 mg | SUBCUTANEOUS | 3 refills | Status: DC

## 2020-07-22 MED ORDER — COLESEVELAM HCL 625 MG PO TABS: 2500.0000 mg | ORAL_TABLET | Freq: Every day | ORAL | 3 refills | Status: DC

## 2020-07-22 NOTE — Progress Notes (Signed)
Subjective:    Patient ID: Steven Mays, male    DOB: Aug 21, 1960, 59 y.o.   MRN: [id number]  HPI Pt returns for f/u of diabetes mellitus: DM type: 2 Dx'ed: 4010 Complications: renal insuff Therapy: Trulicity and 4 oral meds DKA: never Severe hypoglycemia: never Pancreatitis: never Pancreatic imaging: never Other: he has never been on insulin; he did not tolerate metformin (diarrhea); edema limits rx options; fructosamine has converted to lower A1c than A1c itself.    Interval history: no cbg record, but states cbg's vary from 130-190.  pt states he feels well in general.  He takes meds as rx'ed.  Pt says he seldom misses meds.  Past Medical History:  Diagnosis Date  . Anxiety   . Depression   . Diabetes mellitus without complication ([place])   . Essential tremor   . Sleep apnea    cannot tolerate cpap    Past Surgical History:  Procedure Laterality Date  . lumbar lamincectomy  2008  . TONSILLECTOMY  1981    Social History   Socioeconomic History  . Marital status: Married    Spouse name: Not on file  . Number of children: Not on file  . Years of education: Not on file  . Highest education level: Not on file  Occupational History  . Not on file  Tobacco Use  . Smoking status: Former Smoker    Quit date: 12/28/2006    Years since quitting: 13.5  . Smokeless tobacco: Never Used  Substance and Sexual Activity  . Alcohol use: Yes    Alcohol/week: 2.0 standard drinks    Types: 2 Cans of beer per week  . Drug use: No  . Sexual activity: Not on file  Other Topics Concern  . Not on file  Social History Narrative  . Not on file   Social Determinants of Health   Financial Resource Strain: Not on file  Food Insecurity: Not on file  Transportation Needs: Not on file  Physical Activity: Not on file  Stress: Not on file  Social Connections: Not on file  Intimate Partner Violence: Not on file    Current Outpatient Medications on File Prior to Visit  Medication  Sig Dispense Refill  . ALPRAZolam (XANAX) 0.5 MG tablet Take 0.5 mg by mouth as needed.     Marland Kitchen aspirin 81 MG tablet Take 81 mg by mouth daily.    . bromocriptine (PARLODEL) 5 MG capsule Take 1 capsule (5 mg total) by mouth daily. 90 capsule 3  . buPROPion (WELLBUTRIN XL) 300 MG 24 hr tablet Take 300 mg by mouth daily.    . busPIRone (BUSPAR) 15 MG tablet Take 15 mg by mouth daily.    . empagliflozin (JARDIANCE) 25 MG TABS tablet Take 25 mg by mouth daily.    Marland Kitchen glimepiride (AMARYL) 4 MG tablet Take 1 tablet (4 mg total) by mouth daily before breakfast. 90 tablet 3  . glucose blood (ACCU-CHEK GUIDE) test strip 1 each by Other route daily. And lancets 1/day 100 each 3  . Multiple Vitamin (MULTIVITAMIN) tablet Take 1 tablet by mouth daily.    . propranolol (INNOPRAN XL) 80 MG 24 hr capsule Take 80 mg by mouth daily.    Marland Kitchen testosterone enanthate (DELATESTRYL) 200 MG/ML injection Inject into the muscle every 14 (fourteen) days. For IM use only    . traZODone (DESYREL) 100 MG tablet Take 100 mg by mouth at bedtime.     No current facility-administered medications on file prior  to visit.    Allergies  Allergen Reactions  . Amoxicillin-Pot Clavulanate   . Clindamycin   . Effexor Xr [Venlafaxine Hcl Er] Other (See Comments)  . Metformin And Related Diarrhea  . Nefazodone Other (See Comments)    hallucinations  . Tetracyclines & Related Nausea And Vomiting    Family History  Problem Relation Age of Onset  . Hypertension Other   . Cancer Other   . Colon cancer Neg Hx   . Stomach cancer Neg Hx   . Diabetes Neg Hx     BP 130/86 (BP Location: Right Arm, Patient Position: Sitting, Cuff Size: Normal)   Pulse 73   Ht 5\' 10"  (1.778 m)   Wt 213 lb (96.6 kg)   SpO2 96%   BMI 30.56 kg/m    Review of Systems     Objective:   Physical Exam VITAL SIGNS:  See vs page GENERAL: no distress    A1c=9.6%    Assessment & Plan:  Type 2 DM: uncontrolled  Patient Instructions  check your  blood sugar once a day.  vary the time of day when you check, between before the 3 meals, and at bedtime.  also check if you have symptoms of your blood sugar being too high or too low.  please keep a record of the readings and bring it to your next appointment here (or you can bring the meter itself).  You can write it on any piece of paper.  please call us sooner if your blood sugar goes below 70, or if you have a lot of readings over 200.   I have sent a prescription to your pharmacy, to increase the Trulicity. Please continue the same other diabetes medications.   We can add acarbose also if necessary.  Please come back for a follow-up appointment in 2 months.

## 2020-07-22 NOTE — Patient Instructions (Addendum)
check your blood sugar once a day.  vary the time of day when you check, between before the 3 meals, and at bedtime.  also check if you have symptoms of your blood sugar being too high or too low.  please keep a record of the readings and bring it to your next appointment here (or you can bring the meter itself).  You can write it on any piece of paper.  please call us sooner if your blood sugar goes below 70, or if you have a lot of readings over 200.   I have sent a prescription to your pharmacy, to increase the Trulicity. Please continue the same other diabetes medications.   We can add acarbose also if necessary.  Please come back for a follow-up appointment in 2 months.

## 2020-08-05 ENCOUNTER — Telehealth: Payer: Self-pay | Admitting: Endocrinology

## 2020-08-05 MED ORDER — BYDUREON BCISE 2 MG/0.85ML ~~LOC~~ AUIJ: 2.0000 mg | AUTO-INJECTOR | SUBCUTANEOUS | 3 refills | Status: DC

## 2020-08-05 NOTE — Telephone Encounter (Signed)
please contact patient: Ins wants you to change to bydureon.  I have sent a prescription to your pharmacy.  I'll see you next time.

## 2020-08-06 NOTE — Telephone Encounter (Signed)
Spoken to patient and notified Dr Cordelia Pen comments. Verbalized understanding.

## 2020-08-07 ENCOUNTER — Other Ambulatory Visit: Payer: Self-pay | Admitting: Endocrinology

## 2020-08-07 MED ORDER — TRULICITY 4.5 MG/0.5ML ~~LOC~~ SOAJ: 4.5000 mg | SUBCUTANEOUS | 3 refills | Status: DC

## 2020-08-13 ENCOUNTER — Telehealth: Payer: Self-pay

## 2020-08-13 NOTE — Telephone Encounter (Signed)
PA for Trulicity Status:Approved;Review Type:Prior Auth;Coverage Start Date:08/13/2020;Coverage End Date:08/13/2021;

## 2020-10-01 ENCOUNTER — Ambulatory Visit: Payer: Managed Care, Other (non HMO) | Admitting: Endocrinology

## 2020-10-01 ENCOUNTER — Other Ambulatory Visit: Payer: Self-pay

## 2020-10-01 VITALS — BP 156/100 | HR 79 | Ht 70.0 in | Wt 218.2 lb

## 2020-10-01 DIAGNOSIS — E119 Type 2 diabetes mellitus without complications: Secondary | ICD-10-CM | POA: Diagnosis not present

## 2020-10-01 LAB — POCT GLYCOSYLATED HEMOGLOBIN (HGB A1C): Hemoglobin A1C: 10.3 % — AB (ref 4.0–5.6)

## 2020-10-01 MED ORDER — TRULICITY 4.5 MG/0.5ML ~~LOC~~ SOAJ: 4.5000 mg | SUBCUTANEOUS | 3 refills | Status: DC

## 2020-10-01 MED ORDER — ACARBOSE 25 MG PO TABS: 12.5000 mg | ORAL_TABLET | Freq: Two times a day (BID) | ORAL | 11 refills | Status: DC

## 2020-10-01 NOTE — Progress Notes (Signed)
Subjective:    Patient ID: Steven Mays, male    DOB: [birth year], 60 y.o.   MRN: [id number]  HPI Pt returns for f/u of diabetes mellitus:  DM type: 2 Dx'ed: 7867 Complications: CRI Therapy: Trulicity and 4 oral meds.   DKA: never Severe hypoglycemia: never Pancreatitis: never Pancreatic imaging: never Other: he has never been on insulin; he did not tolerate metformin (diarrhea); edema limits rx options; fructosamine has converted to lower A1c than A1c itself.    Interval history: no cbg record, but states cbg's vary from 95-275.  pt states he feels well in general.  He takes meds as rx'ed.  Pt says he misses trulicity approx 1-2 times per month, as pharmacy is often out of it.  Lab is not available today.   Past Medical History:  Diagnosis Date   Anxiety    Depression    Diabetes mellitus without complication ([place])    Essential tremor    Sleep apnea    cannot tolerate cpap    Past Surgical History:  Procedure Laterality Date   lumbar lamincectomy  2008   TONSILLECTOMY  1981    Social History   Socioeconomic History   Marital status: Married    Spouse name: Not on file   Number of children: Not on file   Years of education: Not on file   Highest education level: Not on file  Occupational History   Not on file  Tobacco Use   Smoking status: Former    Types: Cigarettes    Quit date: 12/28/2006    Years since quitting: 13.7   Smokeless tobacco: Never  Substance and Sexual Activity   Alcohol use: Yes    Alcohol/week: 2.0 standard drinks    Types: 2 Cans of beer per week   Drug use: No   Sexual activity: Not on file  Other Topics Concern   Not on file  Social History Narrative   Not on file   Social Determinants of Health   Financial Resource Strain: Not on file  Food Insecurity: Not on file  Transportation Needs: Not on file  Physical Activity: Not on file  Stress: Not on file  Social Connections: Not on file  Intimate Partner Violence: Not on file     Current Outpatient Medications on File Prior to Visit  Medication Sig Dispense Refill   ALPRAZolam (XANAX) 0.5 MG tablet Take 0.5 mg by mouth as needed.      aspirin 81 MG tablet Take 81 mg by mouth daily.     bromocriptine (PARLODEL) 5 MG capsule Take 1 capsule (5 mg total) by mouth daily. 90 capsule 3   buPROPion (WELLBUTRIN XL) 300 MG 24 hr tablet Take 300 mg by mouth daily.     busPIRone (BUSPAR) 15 MG tablet Take 15 mg by mouth daily.     colesevelam (WELCHOL) 625 MG tablet Take 4 tablets (2,500 mg total) by mouth daily. 360 tablet 3   empagliflozin (JARDIANCE) 25 MG TABS tablet Take 25 mg by mouth daily.     glimepiride (AMARYL) 4 MG tablet Take 1 tablet (4 mg total) by mouth daily before breakfast. 90 tablet 3   glucose blood (ACCU-CHEK GUIDE) test strip 1 each by Other route daily. And lancets 1/day 100 each 3   Multiple Vitamin (MULTIVITAMIN) tablet Take 1 tablet by mouth daily.     propranolol (INNOPRAN XL) 80 MG 24 hr capsule Take 80 mg by mouth daily.     testosterone enanthate (DELATESTRYL)  200 MG/ML injection Inject into the muscle every 14 (fourteen) days. For IM use only     traZODone (DESYREL) 100 MG tablet Take 100 mg by mouth at bedtime.     No current facility-administered medications on file prior to visit.    Allergies  Allergen Reactions   Amoxicillin-Pot Clavulanate    Clindamycin    Effexor Xr [Venlafaxine Hcl Er] Other (See Comments)   Metformin And Related Diarrhea   Nefazodone Other (See Comments)    hallucinations   Tetracyclines & Related Nausea And Vomiting    Family History  Problem Relation Age of Onset   Hypertension Other    Cancer Other    Colon cancer Neg Hx    Stomach cancer Neg Hx    Diabetes Neg Hx     BP (!) 156/100 (BP Location: Right Arm, Patient Position: Sitting, Cuff Size: Large)   Pulse 79   Ht 5\' 10"  (1.778 m)   Wt 218 lb 3.2 oz (99 kg)   SpO2 94%   BMI 31.31 kg/m    Review of Systems     Objective:   Physical  Exam Pulses: dorsalis pedis intact bilat.   MSK: no deformity of the feet.   CV: trace bilat leg edema, and bilat vv's.  Skin:  no ulcer on the feet.  normal color and temp on the feet.   Neuro: sensation is intact to touch on the feet.   EXT: left great toenail is partially absent.    Lab Results  Component Value Date   HGBA1C 10.3 (A) 10/01/2020       Assessment & Plan:  Type 2 DM: uncontrolled.  I advised insulin, but he declines.    Patient Instructions  check your blood sugar once a day.  vary the time of day when you check, between before the 3 meals, and at bedtime.  also check if you have symptoms of your blood sugar being too high or too low.  please keep a record of the readings and bring it to your next appointment here (or you can bring the meter itself).  You can write it on any piece of paper.  please call us sooner if your blood sugar goes below 70, or if you have a lot of readings over 200.   I have sent 2 prescription to your pharmacy: to add acarbose, and to send the Trulicity to [facility].   Please continue the same other diabetes medications.   Please come back for a follow-up appointment in 2 months.

## 2020-10-01 NOTE — Patient Instructions (Addendum)
check your blood sugar once a day.  vary the time of day when you check, between before the 3 meals, and at bedtime.  also check if you have symptoms of your blood sugar being too high or too low.  please keep a record of the readings and bring it to your next appointment here (or you can bring the meter itself).  You can write it on any piece of paper.  please call us sooner if your blood sugar goes below 70, or if you have a lot of readings over 200.   I have sent 2 prescription to your pharmacy: to add acarbose, and to send the Trulicity to [facility].   Please continue the same other diabetes medications.   Please come back for a follow-up appointment in 2 months.

## 2020-10-02 ENCOUNTER — Telehealth: Payer: Self-pay | Admitting: Pharmacy Technician

## 2020-10-02 NOTE — Telephone Encounter (Addendum)
Patient Advocate Encounter   Received notification from [facility] that prior authorization for TRULICITY is required.   PA submitted on 10/02/2020 Key [id number] Status is NOT NEEDED  Called pharmacy, can fill 10/11/2020    [facility] will continue to follow.   Venida Jarvis. Nadara Mustard, CPhT Patient Advocate [facility] Phone: [phone] Fax:  [phone]

## 2021-01-02 ENCOUNTER — Ambulatory Visit: Payer: Managed Care, Other (non HMO) | Admitting: Endocrinology

## 2021-01-02 ENCOUNTER — Other Ambulatory Visit: Payer: Self-pay

## 2021-01-02 VITALS — BP 122/68 | HR 74 | Ht 70.0 in | Wt 214.4 lb

## 2021-01-02 DIAGNOSIS — E119 Type 2 diabetes mellitus without complications: Secondary | ICD-10-CM | POA: Diagnosis not present

## 2021-01-02 LAB — POCT GLYCOSYLATED HEMOGLOBIN (HGB A1C): Hemoglobin A1C: 8.4 % — AB (ref 4.0–5.6)

## 2021-01-02 MED ORDER — ACARBOSE 25 MG PO TABS: 12.5000 mg | ORAL_TABLET | Freq: Two times a day (BID) | ORAL | 3 refills | Status: DC

## 2021-01-02 NOTE — Patient Instructions (Addendum)
check your blood sugar once a day.  vary the time of day when you check, between before the 3 meals, and at bedtime.  also check if you have symptoms of your blood sugar being too high or too low.  please keep a record of the readings and bring it to your next appointment here (or you can bring the meter itself).  You can write it on any piece of paper.  please call us sooner if your blood sugar goes below 70, or if you have a lot of readings over 200.   I have sent a prescription to your pharmacy, for the acarbose. Please continue the same other diabetes medications.   Please come back for a follow-up appointment in 3 months.

## 2021-01-02 NOTE — Progress Notes (Signed)
Subjective:    Patient ID: Steven Mays, male    DOB: [birth year], 60 y.o.   MRN: [id number]  HPI Pt returns for f/u of diabetes mellitus:  DM type: 2 Dx'ed: 7858 Complications: CRI Therapy: Trulicity and 5 oral meds.   DKA: never Severe hypoglycemia: never Pancreatitis: never Pancreatic imaging: never Other: he has never been on insulin; he did not tolerate metformin (diarrhea); edema limits rx options; fructosamine has converted to lower A1c than A1c itself.    Interval history: no cbg record, but states cbg's vary from 120-275.  pt states he feels well in general.  He takes meds as rx'ed, except acarbose.   Past Medical History:  Diagnosis Date   Anxiety    Depression    Diabetes mellitus without complication ([place])    Essential tremor    Sleep apnea    cannot tolerate cpap    Past Surgical History:  Procedure Laterality Date   lumbar lamincectomy  2008   TONSILLECTOMY  1981    Social History   Socioeconomic History   Marital status: Married    Spouse name: Not on file   Number of children: Not on file   Years of education: Not on file   Highest education level: Not on file  Occupational History   Not on file  Tobacco Use   Smoking status: Former    Types: Cigarettes    Quit date: 12/28/2006    Years since quitting: 14.0   Smokeless tobacco: Never  Substance and Sexual Activity   Alcohol use: Yes    Alcohol/week: 2.0 standard drinks    Types: 2 Cans of beer per week   Drug use: No   Sexual activity: Not on file  Other Topics Concern   Not on file  Social History Narrative   Not on file   Social Determinants of Health   Financial Resource Strain: Not on file  Food Insecurity: Not on file  Transportation Needs: Not on file  Physical Activity: Not on file  Stress: Not on file  Social Connections: Not on file  Intimate Partner Violence: Not on file    Current Outpatient Medications on File Prior to Visit  Medication Sig Dispense Refill    ALPRAZolam (XANAX) 0.5 MG tablet Take 0.5 mg by mouth as needed.      aspirin 81 MG tablet Take 81 mg by mouth daily.     bromocriptine (PARLODEL) 5 MG capsule Take 1 capsule (5 mg total) by mouth daily. 90 capsule 3   buPROPion (WELLBUTRIN XL) 300 MG 24 hr tablet Take 300 mg by mouth daily.     busPIRone (BUSPAR) 15 MG tablet Take 15 mg by mouth daily.     colesevelam (WELCHOL) 625 MG tablet Take 4 tablets (2,500 mg total) by mouth daily. 360 tablet 3   Dulaglutide (TRULICITY) 4.5 IF/0.2DX SOPN Inject 4.5 mg as directed once a week. 6 mL 3   empagliflozin (JARDIANCE) 25 MG TABS tablet Take 25 mg by mouth daily.     glimepiride (AMARYL) 4 MG tablet Take 1 tablet (4 mg total) by mouth daily before breakfast. 90 tablet 3   glucose blood (ACCU-CHEK GUIDE) test strip 1 each by Other route daily. And lancets 1/day 100 each 3   Multiple Vitamin (MULTIVITAMIN) tablet Take 1 tablet by mouth daily.     propranolol (INNOPRAN XL) 80 MG 24 hr capsule Take 80 mg by mouth daily.     testosterone enanthate (DELATESTRYL) 200 MG/ML injection Inject  into the muscle every 14 (fourteen) days. For IM use only     traZODone (DESYREL) 100 MG tablet Take 100 mg by mouth at bedtime.     No current facility-administered medications on file prior to visit.    Allergies  Allergen Reactions   Amoxicillin-Pot Clavulanate    Clindamycin    Effexor Xr [Venlafaxine Hcl Er] Other (See Comments)   Metformin And Related Diarrhea   Nefazodone Other (See Comments)    hallucinations   Tetracyclines & Related Nausea And Vomiting    Family History  Problem Relation Age of Onset   Hypertension Other    Cancer Other    Colon cancer Neg Hx    Stomach cancer Neg Hx    Diabetes Neg Hx     BP 122/68 (BP Location: Right Arm, Patient Position: Sitting, Cuff Size: Large)   Pulse 74   Ht 5\' 10"  (1.778 m)   Wt 214 lb 6.4 oz (97.3 kg)   SpO2 95%   BMI 30.76 kg/m    Review of Systems     Objective:   Physical  Exam Pulses: dorsalis pedis intact bilat.   MSK: no deformity of the feet CV: no leg edema Skin:  no ulcer on the feet.  normal color and temp on the feet.   Neuro: sensation is intact to touch on the feet.    A1c=8.4%     Assessment & Plan:  Type 2 DM: uncontrolled.  I advised insulin, but he declines.    Patient Instructions  check your blood sugar once a day.  vary the time of day when you check, between before the 3 meals, and at bedtime.  also check if you have symptoms of your blood sugar being too high or too low.  please keep a record of the readings and bring it to your next appointment here (or you can bring the meter itself).  You can write it on any piece of paper.  please call us sooner if your blood sugar goes below 70, or if you have a lot of readings over 200.   I have sent a prescription to your pharmacy, for the acarbose. Please continue the same other diabetes medications.   Please come back for a follow-up appointment in 3 months.

## 2021-04-22 ENCOUNTER — Other Ambulatory Visit: Payer: Self-pay

## 2021-04-22 ENCOUNTER — Ambulatory Visit: Payer: Managed Care, Other (non HMO) | Admitting: Endocrinology

## 2021-04-22 VITALS — BP 140/90 | HR 73 | Ht 70.0 in | Wt 221.6 lb

## 2021-04-22 DIAGNOSIS — E119 Type 2 diabetes mellitus without complications: Secondary | ICD-10-CM

## 2021-04-22 LAB — POCT GLYCOSYLATED HEMOGLOBIN (HGB A1C): Hemoglobin A1C: 11.2 % — AB (ref 4.0–5.6)

## 2021-04-22 MED ORDER — FREESTYLE LIBRE 2 SENSOR MISC: 1.0000 | 3 refills | Status: DC

## 2021-04-22 MED ORDER — BASAGLAR KWIKPEN 100 UNIT/ML ~~LOC~~ SOPN: 20.0000 [IU] | PEN_INJECTOR | SUBCUTANEOUS | 3 refills | Status: DC

## 2021-04-22 NOTE — Patient Instructions (Addendum)
check your blood sugar once a day.  vary the time of day when you check, between before the 3 meals, and at bedtime.  also check if you have symptoms of your blood sugar being too high or too low.  please keep a record of the readings and bring it to your next appointment here (or you can bring the meter itself).  You can write it on any piece of paper.  please call us sooner if your blood sugar goes below 70, or if you have a lot of readings over 200.   I have sent a prescription to your pharmacy, to add Basaglar 20 units each morning.   Please continue the same other diabetes medications, but we'll reduce these as we can.    Please come back for a follow-up appointment in 2 months.

## 2021-04-22 NOTE — Progress Notes (Signed)
Subjective:    Patient ID: Steven Mays, male    DOB: 06-13-1960, 61 y.o.   MRN: [id number]  HPI Pt returns for f/u of diabetes mellitus:  DM type: 2 Dx'ed: 8341 Complications: CRI Therapy: Trulicity and 5 oral meds.   DKA: never Severe hypoglycemia: never Pancreatitis: never Pancreatic imaging: never Other: he has never been on insulin, and he declines; he did not tolerate metformin (diarrhea); edema limits rx options; fructosamine has converted to lower A1c than A1c itself.   Interval history: no cbg record, but states cbg's vary from 150-250.  pt states he feels well in general.  He takes meds as rx'ed.   Past Medical History:  Diagnosis Date   Anxiety    Depression    Diabetes mellitus without complication ([place])    Essential tremor    Sleep apnea    cannot tolerate cpap    Past Surgical History:  Procedure Laterality Date   lumbar lamincectomy  2008   TONSILLECTOMY  1981    Social History   Socioeconomic History   Marital status: Married    Spouse name: Not on file   Number of children: Not on file   Years of education: Not on file   Highest education level: Not on file  Occupational History   Not on file  Tobacco Use   Smoking status: Former    Types: Cigarettes    Quit date: 12/28/2006    Years since quitting: 14.3   Smokeless tobacco: Never  Substance and Sexual Activity   Alcohol use: Yes    Alcohol/week: 2.0 standard drinks    Types: 2 Cans of beer per week   Drug use: No   Sexual activity: Not on file  Other Topics Concern   Not on file  Social History Narrative   Not on file   Social Determinants of Health   Financial Resource Strain: Not on file  Food Insecurity: Not on file  Transportation Needs: Not on file  Physical Activity: Not on file  Stress: Not on file  Social Connections: Not on file  Intimate Partner Violence: Not on file    Current Outpatient Medications on File Prior to Visit  Medication Sig Dispense Refill   acarbose  (PRECOSE) 25 MG tablet Take 0.5 tablets (12.5 mg total) by mouth 2 (two) times daily with a meal. 90 tablet 3   ALPRAZolam (XANAX) 0.5 MG tablet Take 0.5 mg by mouth as needed.      aspirin 81 MG tablet Take 81 mg by mouth daily.     bromocriptine (PARLODEL) 5 MG capsule Take 1 capsule (5 mg total) by mouth daily. 90 capsule 3   buPROPion (WELLBUTRIN XL) 300 MG 24 hr tablet Take 300 mg by mouth daily.     busPIRone (BUSPAR) 15 MG tablet Take 15 mg by mouth daily.     colesevelam (WELCHOL) 625 MG tablet Take 4 tablets (2,500 mg total) by mouth daily. 360 tablet 3   Dulaglutide (TRULICITY) 4.5 DQ/2.2WL SOPN Inject 4.5 mg as directed once a week. 6 mL 3   empagliflozin (JARDIANCE) 25 MG TABS tablet Take 25 mg by mouth daily.     glimepiride (AMARYL) 4 MG tablet Take 1 tablet (4 mg total) by mouth daily before breakfast. 90 tablet 3   glucose blood (ACCU-CHEK GUIDE) test strip 1 each by Other route daily. And lancets 1/day 100 each 3   Multiple Vitamin (MULTIVITAMIN) tablet Take 1 tablet by mouth daily.  propranolol (INNOPRAN XL) 80 MG 24 hr capsule Take 80 mg by mouth daily.     testosterone enanthate (DELATESTRYL) 200 MG/ML injection Inject into the muscle every 14 (fourteen) days. For IM use only     traZODone (DESYREL) 100 MG tablet Take 100 mg by mouth at bedtime.     No current facility-administered medications on file prior to visit.    Allergies  Allergen Reactions   Amoxicillin-Pot Clavulanate    Clindamycin    Effexor Xr [Venlafaxine Hcl Er] Other (See Comments)   Metformin And Related Diarrhea   Nefazodone Other (See Comments)    hallucinations   Tetracyclines & Related Nausea And Vomiting    Family History  Problem Relation Age of Onset   Hypertension Other    Cancer Other    Colon cancer Neg Hx    Stomach cancer Neg Hx    Diabetes Neg Hx     BP 140/90    Pulse 73    Ht 5\' 10"  (1.778 m)    Wt 221 lb 9.6 oz (100.5 kg)    SpO2 95%    BMI 31.80 kg/m    Review of  Systems     Objective:   Physical Exam    Lab Results  Component Value Date   HGBA1C 11.2 (A) 04/22/2021      Assessment & Plan:  Type 2 DM: uncontrolled.    Patient Instructions  check your blood sugar once a day.  vary the time of day when you check, between before the 3 meals, and at bedtime.  also check if you have symptoms of your blood sugar being too high or too low.  please keep a record of the readings and bring it to your next appointment here (or you can bring the meter itself).  You can write it on any piece of paper.  please call us sooner if your blood sugar goes below 70, or if you have a lot of readings over 200.   I have sent a prescription to your pharmacy, to add Basaglar 20 units each morning.   Please continue the same other diabetes medications, but we'll reduce these as we can.    Please come back for a follow-up appointment in 2 months.

## 2021-06-19 ENCOUNTER — Other Ambulatory Visit: Payer: Self-pay | Admitting: Endocrinology

## 2021-07-17 ENCOUNTER — Encounter: Payer: Self-pay | Admitting: Endocrinology

## 2021-07-17 ENCOUNTER — Ambulatory Visit: Payer: BC Managed Care – PPO | Admitting: Endocrinology

## 2021-07-17 VITALS — BP 158/92 | HR 82 | Ht 70.0 in | Wt 221.6 lb

## 2021-07-17 DIAGNOSIS — E1165 Type 2 diabetes mellitus with hyperglycemia: Secondary | ICD-10-CM | POA: Diagnosis not present

## 2021-07-17 DIAGNOSIS — E119 Type 2 diabetes mellitus without complications: Secondary | ICD-10-CM

## 2021-07-17 LAB — POCT GLYCOSYLATED HEMOGLOBIN (HGB A1C): Hemoglobin A1C: 9.6 % — AB (ref 4.0–5.6)

## 2021-07-17 MED ORDER — BASAGLAR KWIKPEN 100 UNIT/ML ~~LOC~~ SOPN: 40.0000 [IU] | PEN_INJECTOR | SUBCUTANEOUS | 1 refills | Status: DC

## 2021-07-17 NOTE — Patient Instructions (Addendum)
check your blood sugar once a day.  vary the time of day when you check, between before the 3 meals, and at bedtime.  also check if you have symptoms of your blood sugar being too high or too low.  please keep a record of the readings and bring it to your next appointment here (or you can bring the meter itself).  You can write it on any piece of paper.  please call us sooner if your blood sugar goes below 70, or if you have a lot of readings over 200.   ?I have sent a prescription to your pharmacy, to increase the Basaglar to 40 units each morning.   ?Please continue the same other diabetes medications, but we'll reduce these as we can.    ?You should have an endocrinology follow-up appointment in 2 months.   ?

## 2021-07-17 NOTE — Progress Notes (Signed)
? ?Subjective:  ? ? Patient ID: Steven Mays, male    DOB: Sep 06, 1960, 61 y.o.   MRN: [id number] ? ?HPI ?Pt returns for f/u of diabetes mellitus:  ?DM type: Insulin-requiring type 2 ?Dx'ed: 2016 ?Complications: CRI ?Therapy: insulin since 4128, Trulicity and 5 oral meds.   ?DKA: never ?Severe hypoglycemia: never ?Pancreatitis: never ?Pancreatic imaging: never ?Other: he has never been on insulin, and he declines; he did not tolerate metformin (diarrhea); edema limits rx options; fructosamine has converted to lower A1c than A1c itself; he takes QD insulin, at least for now.    ?Interval history: no cbg record, but states cbg's are approx 250.  pt states he feels well in general.  He takes meds as rx'ed.   ?Past Medical History:  ?Diagnosis Date  ? Anxiety   ? Depression   ? Diabetes mellitus without complication ([place])   ? Essential tremor   ? Sleep apnea   ? cannot tolerate cpap  ? ? ?Past Surgical History:  ?Procedure Laterality Date  ? lumbar lamincectomy  2008  ? TONSILLECTOMY  1981  ? ? ?Social History  ? ?Socioeconomic History  ? Marital status: Married  ?  Spouse name: Not on file  ? Number of children: Not on file  ? Years of education: Not on file  ? Highest education level: Not on file  ?Occupational History  ? Not on file  ?Tobacco Use  ? Smoking status: Former  ?  Types: Cigarettes  ?  Quit date: 12/28/2006  ?  Years since quitting: 14.5  ? Smokeless tobacco: Never  ?Substance and Sexual Activity  ? Alcohol use: Yes  ?  Alcohol/week: 2.0 standard drinks  ?  Types: 2 Cans of beer per week  ? Drug use: No  ? Sexual activity: Not on file  ?Other Topics Concern  ? Not on file  ?Social History Narrative  ? Not on file  ? ?Social Determinants of Health  ? ?Financial Resource Strain: Not on file  ?Food Insecurity: Not on file  ?Transportation Needs: Not on file  ?Physical Activity: Not on file  ?Stress: Not on file  ?Social Connections: Not on file  ?Intimate Partner Violence: Not on file  ? ? ?Current Outpatient  Medications on File Prior to Visit  ?Medication Sig Dispense Refill  ? acarbose (PRECOSE) 25 MG tablet Take 0.5 tablets (12.5 mg total) by mouth 2 (two) times daily with a meal. 90 tablet 3  ? ALPRAZolam (XANAX) 0.5 MG tablet Take 0.5 mg by mouth as needed.     ? aspirin 81 MG tablet Take 81 mg by mouth daily.    ? bromocriptine (PARLODEL) 2.5 MG tablet TAKE 1 TABLET(2.5 MG) BY MOUTH DAILY 30 tablet 11  ? buPROPion (WELLBUTRIN XL) 300 MG 24 hr tablet Take 300 mg by mouth daily.    ? busPIRone (BUSPAR) 15 MG tablet Take 15 mg by mouth daily.    ? colesevelam (WELCHOL) 625 MG tablet Take 4 tablets (2,500 mg total) by mouth daily. 360 tablet 3  ? Continuous Blood Gluc Sensor (FREESTYLE LIBRE 2 SENSOR) MISC 1 Device by Does not apply route every 14 (fourteen) days. 6 each 3  ? Dulaglutide (TRULICITY) 4.5 NO/6.7EH SOPN Inject 4.5 mg as directed once a week. 6 mL 3  ? empagliflozin (JARDIANCE) 25 MG TABS tablet Take 25 mg by mouth daily.    ? glimepiride (AMARYL) 4 MG tablet Take 1 tablet (4 mg total) by mouth daily before breakfast. 90 tablet  3  ? glucose blood (ACCU-CHEK GUIDE) test strip 1 each by Other route daily. And lancets 1/day 100 each 3  ? Multiple Vitamin (MULTIVITAMIN) tablet Take 1 tablet by mouth daily.    ? propranolol (INNOPRAN XL) 80 MG 24 hr capsule Take 80 mg by mouth daily.    ? testosterone enanthate (DELATESTRYL) 200 MG/ML injection Inject into the muscle every 14 (fourteen) days. For IM use only    ? traZODone (DESYREL) 100 MG tablet Take 100 mg by mouth at bedtime.    ? ?No current facility-administered medications on file prior to visit.  ? ? ?Allergies  ?Allergen Reactions  ? Amoxicillin-Pot Clavulanate   ? Clindamycin   ? Effexor Xr [Venlafaxine Hcl Er] Other (See Comments)  ? Metformin And Related Diarrhea  ? Nefazodone Other (See Comments)  ?  hallucinations  ? Tetracyclines & Related Nausea And Vomiting  ? ? ?Family History  ?Problem Relation Age of Onset  ? Hypertension Other   ? Cancer  Other   ? Colon cancer Neg Hx   ? Stomach cancer Neg Hx   ? Diabetes Neg Hx   ? ? ?BP (!) 158/92 (BP Location: Left Arm, Patient Position: Sitting, Cuff Size: Normal)   Pulse 82   Ht '5\' 10"'$  (1.778 m)   Wt 221 lb 9.6 oz (100.5 kg)   SpO2 94%   BMI 31.80 kg/m?  ? ? ?Review of Systems ? ?   ?Objective:  ? Physical Exam ?VITAL SIGNS:  See vs page.   ?GENERAL: no distress.   ? ? ?A1c=9.6% ?   ?Assessment & Plan:  ?Insulin-requiring type 2 DM: uncontrolled ? ?Patient Instructions  ?check your blood sugar once a day.  vary the time of day when you check, between before the 3 meals, and at bedtime.  also check if you have symptoms of your blood sugar being too high or too low.  please keep a record of the readings and bring it to your next appointment here (or you can bring the meter itself).  You can write it on any piece of paper.  please call us sooner if your blood sugar goes below 70, or if you have a lot of readings over 200.   ?I have sent a prescription to your pharmacy, to increase the Basaglar to 40 units each morning.   ?Please continue the same other diabetes medications, but we'll reduce these as we can.    ?You should have an endocrinology follow-up appointment in 2 months.   ? ? ?

## 2021-07-24 ENCOUNTER — Other Ambulatory Visit (HOSPITAL_COMMUNITY): Payer: Self-pay

## 2021-07-24 ENCOUNTER — Telehealth: Payer: Self-pay | Admitting: Pharmacy Technician

## 2021-07-24 NOTE — Telephone Encounter (Signed)
Patient Advocate Encounter ?  ?Received notification from [facility] that prior authorization for Steven Mays is required by his/her insurance [employer]. ? ?Per Test Claim: Pt can get Basaglar with e-voucher for $35, but preferred [employer] copay is $60.  ?  ? ? ?

## 2021-07-25 ENCOUNTER — Other Ambulatory Visit (HOSPITAL_COMMUNITY): Payer: Self-pay

## 2021-07-25 NOTE — Telephone Encounter (Signed)
Patient came by the office 07/25/2021  at 3:36 and picked up sample of [place]. ?

## 2021-07-25 NOTE — Telephone Encounter (Signed)
Informed the patient that samples are available of Basaglar since he is currently almost out and doesn't want to go the weekend without medication ?

## 2021-07-29 NOTE — Telephone Encounter (Signed)
Patient Advocate Encounter ?  ?Received notification from [facility]/CoverMyMeds that prior authorization for Steven Mays is required by his/her insurance [employer]. ?  ?Per Test Claim: Pt can get Basaglar with e-voucher for $35 (from one of our pharmacies), but preferred Semglee copay is $60.  ?  ?PA submitted on 07/29/21 ?Key [id number] ?Status is pending ?   ?[facility] will continue to follow: ? ?Patient Advocate ?Fax:  [phone] ? ?

## 2021-07-31 ENCOUNTER — Other Ambulatory Visit (HOSPITAL_COMMUNITY): Payer: Self-pay

## 2021-07-31 NOTE — Telephone Encounter (Signed)
Patient Advocate Encounter  Received notification from CoverMyMeds/[employer] [place] that the request for prior authorization for Basaglar has been denied due to not meeting the definition o fmedical necessity found in the member's benefit booklet.  (Pt must try and fail or be unable to take ALL formulary alternatives.)

## 2021-08-01 MED ORDER — INSULIN GLARGINE-YFGN 100 UNIT/ML ~~LOC~~ SOLN: 40.0000 [IU] | Freq: Every day | SUBCUTANEOUS | 3 refills | Status: DC

## 2021-08-01 NOTE — Addendum Note (Signed)
Addended by: Dorita Sciara on: 08/01/2021 08:24 AM   Modules accepted: Orders

## 2021-08-18 ENCOUNTER — Other Ambulatory Visit (HOSPITAL_COMMUNITY): Payer: Self-pay

## 2021-10-09 DIAGNOSIS — E781 Pure hyperglyceridemia: Secondary | ICD-10-CM | POA: Diagnosis not present

## 2021-10-09 DIAGNOSIS — I10 Essential (primary) hypertension: Secondary | ICD-10-CM | POA: Diagnosis not present

## 2021-10-09 DIAGNOSIS — Z23 Encounter for immunization: Secondary | ICD-10-CM | POA: Diagnosis not present

## 2021-10-09 DIAGNOSIS — Z0000 Encounter for general adult medical examination without abnormal findings: Secondary | ICD-10-CM | POA: Diagnosis not present

## 2021-10-09 DIAGNOSIS — Z125 Encounter for screening for malignant neoplasm of prostate: Secondary | ICD-10-CM | POA: Diagnosis not present

## 2021-11-13 ENCOUNTER — Ambulatory Visit: Payer: BC Managed Care – PPO | Admitting: Internal Medicine

## 2021-11-13 ENCOUNTER — Encounter: Payer: Self-pay | Admitting: Internal Medicine

## 2021-11-13 VITALS — BP 126/80 | HR 71 | Ht 70.0 in | Wt 223.0 lb

## 2021-11-13 DIAGNOSIS — E119 Type 2 diabetes mellitus without complications: Secondary | ICD-10-CM

## 2021-11-13 LAB — POCT GLYCOSYLATED HEMOGLOBIN (HGB A1C): Hemoglobin A1C: 11.1 % — AB (ref 4.0–5.6)

## 2021-11-13 MED ORDER — INSULIN GLARGINE-YFGN 100 UNIT/ML ~~LOC~~ SOLN: 50.0000 [IU] | Freq: Every day | SUBCUTANEOUS | 6 refills | Status: DC

## 2021-11-13 MED ORDER — TRULICITY 4.5 MG/0.5ML ~~LOC~~ SOAJ: 4.5000 mg | SUBCUTANEOUS | 3 refills | Status: DC

## 2021-11-13 MED ORDER — EMPAGLIFLOZIN 25 MG PO TABS: 25.0000 mg | ORAL_TABLET | Freq: Every day | ORAL | 3 refills | Status: DC

## 2021-11-13 MED ORDER — FREESTYLE LIBRE 2 SENSOR MISC: 1.0000 | 3 refills | Status: DC

## 2021-11-13 MED ORDER — INSULIN PEN NEEDLE 32G X 4 MM MISC: 1.0000 | Freq: Four times a day (QID) | 3 refills | Status: DC

## 2021-11-13 MED ORDER — NOVOLOG FLEXPEN 100 UNIT/ML ~~LOC~~ SOPN: PEN_INJECTOR | SUBCUTANEOUS | 3 refills | Status: DC

## 2021-11-13 NOTE — Progress Notes (Addendum)
Name: Steven Mays  Age/ Sex: 61 y.o., male   MRN/ DOB: [id number], 1960/06/23     PCP: Gaynelle Arabian, MD   Reason for Endocrinology Evaluation: Type 2 Diabetes Mellitus  Initial Endocrine Consultative Visit: 03/22/2018    PATIENT IDENTIFIER: Steven Mays is a 60 y.o. male with a past medical history of T2DM, anxiety and depression, OSA. The patient has followed with Endocrinology clinic since 03/22/2018 for consultative assistance with management of his diabetes.  DIABETIC HISTORY:  Steven Mays was diagnosed with DM 2016, intolerant to metformin due to diarrhea , and started insulin therapy in 2023. Marland Kitchen His hemoglobin A1c has ranged from 8.4% in 12/2020, peaking at 11.5% in 04/2021  He was followed by Dr. Loanne Drilling from January 2020 until May 2023 SUBJECTIVE:   During the last visit (07/17/2021): Saw Dr. Loanne Drilling  Today (11/13/2021): Steven Mays is here for follow-up on diabetes management.  He checks his blood sugars multiple  times daily, through CGM. The patient has not had hypoglycemic episodes since the last clinic visit.   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     Statin: Yes ACE-I/ARB: Yes     CONTINUOUS GLUCOSE MONITORING RECORD INTERPRETATION    Dates of Recording: 8/12 - 11/07/2021  Sensor description:8/12-8/25/2023  Results statistics:   CGM use % of time 38  Average and SD 310/18.7  Time in range    0    %  % Time Above 180 0  % Time above 250   % Time Below target    Glycemic patterns  summary: hyperglycemia noted all day and nigh   Hyperglycemic episodes  all night and worse with meals  Hypoglycemic episodes occurred n/a  Overnight periods: high     DIABETIC COMPLICATIONS: Microvascular complications:   Denies: CKD Last Eye Exam: Completed 06/2020  Macrovascular complications:   Denies: CAD, CVA, PVD   HISTORY:  Past Medical History:  Past Medical History:  Diagnosis Date   Anxiety    Depression    Diabetes mellitus without complication ([place])    Essential tremor    Sleep apnea    cannot tolerate cpap   Past Surgical History:  Past Surgical History:  Procedure Laterality Date   lumbar lamincectomy  2008   TONSILLECTOMY  1981   Social History:  reports that he quit smoking about 14 years ago. His smoking use included cigarettes. He has never used smokeless tobacco. He reports current alcohol use of about 2.0 standard drinks of alcohol per week. He reports that he does not use drugs. Family History:  Family History  Problem Relation Age of Onset   Hypertension Other    Cancer Other    Colon cancer Neg Hx    Stomach cancer Neg Hx    Diabetes Neg Hx      HOME MEDICATIONS: Allergies as of 11/13/2021       Reactions   Amoxicillin-pot Clavulanate    Clindamycin    Effexor  Xr [venlafaxine Hcl Er] Other (See Comments)   Metformin And Related Diarrhea   Nefazodone Other (See Comments)   hallucinations   Tetracyclines & Related Nausea And Vomiting        Medication List        Accurate as of November 13, 2021 10:33 AM. If you have any questions, ask your nurse or doctor.          STOP taking these medications    acarbose 25 MG tablet Commonly known as: PRECOSE Stopped by: Dorita Sciara, MD   bromocriptine 2.5 MG tablet Commonly known as: PARLODEL Stopped by: Dorita Sciara, MD   colesevelam 625 MG tablet Commonly known as: WELCHOL Stopped by: Dorita Sciara, MD   glimepiride 4 MG tablet Commonly known  as: AMARYL Stopped by: Dorita Sciara, MD       TAKE these medications    Accu-Chek Guide test strip Generic drug: glucose blood 1 each by Other route daily. And lancets 1/day   ALPRAZolam 0.5 MG tablet Commonly known as: XANAX Take 0.5 mg by mouth as needed.   aspirin 81 MG tablet Take 81 mg by mouth daily.   buPROPion 300 MG 24 hr tablet Commonly known as: WELLBUTRIN XL Take 300 mg by mouth daily.   busPIRone 15 MG tablet Commonly known as: BUSPAR Take 15 mg by mouth daily.   empagliflozin 25 MG Tabs tablet Commonly known as: JARDIANCE Take 1 tablet (25 mg total) by mouth daily.   FreeStyle Libre 2 Sensor Misc 1 Device by Does not apply route every 14 (fourteen) days.   insulin glargine-yfgn 100 UNIT/ML injection Commonly known as: Semglee (yfgn) Inject 0.5 mLs (50 Units total) into the skin daily. What changed: how much to take Changed by: Dorita Sciara, MD   Insulin Pen Needle 32G X 4 MM Misc 1 Device by Does not apply route in the morning, at noon, in the evening, and at bedtime. Started by: Dorita Sciara, MD   multivitamin tablet Take 1 tablet by mouth daily.   NovoLOG FlexPen 100 UNIT/ML FlexPen Generic drug: insulin aspart Max daily 45 units Started by: Dorita Sciara, MD   propranolol 80 MG 24 hr capsule Commonly known as: INNOPRAN XL Take 80 mg by mouth daily.   rosuvastatin 10 MG tablet Commonly known as: CRESTOR Take 10 mg by mouth daily.   testosterone enanthate 200 MG/ML injection Commonly known as: DELATESTRYL Inject into the muscle every 14 (fourteen) days. For IM use only   traZODone 100 MG tablet Commonly known as: DESYREL Take 100 mg by mouth at bedtime.   Trulicity 4.5 HM/0.9OB Sopn Generic drug: Dulaglutide Inject 4.5 mg as directed once a week.         OBJECTIVE:   Vital Signs: BP 126/80 (BP Location: Left Arm, Patient Position: Sitting, Cuff Size: Large)   Pulse 71   Ht '5\' 10"'$  (1.778 m)    Wt 223 lb (101.2 kg)   SpO2 94%   BMI 32.00 kg/m   Wt Readings from Last 3 Encounters:  11/13/21 223 lb (101.2 kg)  07/17/21 221 lb 9.6 oz (100.5 kg)  04/22/21 221 lb 9.6 oz (100.5 kg)     Exam: General: Pt appears well and is in NAD  Lungs: Clear with good BS bilat with no rales, rhonchi, or wheezes  Heart: RRR   Abdomen: soft, nontender  Extremities: No pretibial edema.   Neuro: MS is good with appropriate affect, pt is alert and  Ox3         DATA REVIEWED:  Lab Results  Component Value Date   HGBA1C 11.1 (A) 11/13/2021   HGBA1C 9.6 (A) 07/17/2021   HGBA1C 11.2 (A) 04/22/2021    10/09/2021 GFR 99 BUN/CR 18/0.85 HDL 42 LDL 125 MA/CR 0.7 TG 169  ASSESSMENT / PLAN / RECOMMENDATIONS:   1) Type 2 Diabetes Mellitus, poorly controlled, Without complications - Most recent A1c of 11.1 %. Goal A1c < 7.0 %.    -Unfortunately patient continues with hyperglycemia despite being on multiple glycemic agents -I discussed with the patient that he is glucose toxic and I have recommended starting him on prandial dose of insulin -Patient endorses pain with Trulicity injections, I have advised him to take take it out of the fridge elevated at room temperature for 30 minutes prior to injecting, if he continues with pain we can switch to Ozempic, there is a shortage of supply and Trulicity and he is having issues obtaining doses at times -He is also going to change his insurance company, we discussed that once he has the new insurance we may discuss alternative diabetes technology such as insulin pump or CeQur  -I will stop acrobatics, bromocriptine, colesevelam, and glimepiride -He was instructed on taking prandial dose of insulin with each meal plus using a correction scale that will be provided today   MEDICATIONS: Stopped acarbose Stop bromocriptine Stop glimepiride Stop colesevelam Continue Jardiance 25 mg daily Continue Trulicity 4.5 mg weekly Increase Semglee to 50 units  daily Start NovoLog 10 units 3 times daily before every meal Start correction factor : NovoLog (BG -130/25)  EDUCATION / INSTRUCTIONS: BG monitoring instructions: Patient is instructed to check his blood sugars 3 times a day, before meals. Call [facility] if: BG persistently < 70  I reviewed the Rule of 15 for the treatment of hypoglycemia in detail with the patient. Literature supplied.   2) Diabetic complications:  Eye: Does not at have known diabetic retinopathy.  Neuro/ Feet: Does not have known diabetic peripheral neuropathy .  Renal: Patient does not have known baseline CKD. He   is not on an ACEI/ARB at present.     F/U in 4 months   Signed electronically by: Mack Guise, MD  [facility]  [facility] [address], [place] [address] Phone: [phone] FAX: [phone]   CC: Gaynelle Arabian, MD [address] [place] [address] Phone: [phone]  Fax: [phone]  Return to Endocrinology clinic as below: Future Appointments  Date Time Provider [place]  04/07/2022  7:30 AM Eliza Green, Melanie Crazier, MD [facility] None

## 2021-11-13 NOTE — Patient Instructions (Signed)
STOP Acarbose STOP Bromocriptine  STOP Colesevelam  STOP Glimepiride  Continue Jardiance 25 mg daily  Continue Trulicity 4.5 mg weekly  Start Novolog 10 units with each meal ( as a baseline ) Increase Semglee to 50 units ONCE daily  Novolog correctional insulin: ADD extra units on insulin to your meal-time Novolog dose if your blood sugars are higher than 155. Use the scale below to help guide you:   Blood sugar before meal Number of units to inject  Less than 155 0 unit  156 -  180 1 units  181 -  205 2 units  206 -  230 3 units  231 -  255 4 units  256 -  280 5 units  281 -  305 6 units  306 -  330 7 units  331 -  355 8 units  356 - 380 9 units  381 - 405 10 units   406 - 430 11 units     HOW TO TREAT LOW BLOOD SUGARS (Blood sugar LESS THAN 70 MG/DL) Please follow the RULE OF 15 for the treatment of hypoglycemia treatment (when your (blood sugars are less than 70 mg/dL)   STEP 1: Take 15 grams of carbohydrates when your blood sugar is low, which includes:  3-4 GLUCOSE TABS  OR 3-4 OZ OF JUICE OR REGULAR SODA OR ONE TUBE OF GLUCOSE GEL    STEP 2: RECHECK blood sugar in 15 MINUTES STEP 3: If your blood sugar is still low at the 15 minute recheck --> then, go back to STEP 1 and treat AGAIN with another 15 grams of carbohydrates.

## 2021-11-14 ENCOUNTER — Encounter: Payer: Self-pay | Admitting: Internal Medicine

## 2021-12-16 ENCOUNTER — Encounter: Payer: Self-pay | Admitting: Gastroenterology

## 2021-12-16 ENCOUNTER — Ambulatory Visit (AMBULATORY_SURGERY_CENTER): Payer: Self-pay

## 2021-12-16 VITALS — Ht 70.0 in | Wt 216.0 lb

## 2021-12-16 DIAGNOSIS — Z1211 Encounter for screening for malignant neoplasm of colon: Secondary | ICD-10-CM

## 2021-12-16 MED ORDER — NA SULFATE-K SULFATE-MG SULF 17.5-3.13-1.6 GM/177ML PO SOLN: 1.0000 | ORAL | 0 refills | Status: DC

## 2021-12-16 NOTE — Progress Notes (Signed)
No egg or soy allergy known to patient  No issues known to pt with past sedation with any surgeries or procedures Patient denies ever being told they had issues or difficulty with intubation  No FH of Malignant Hyperthermia Pt is not on diet pills Pt is not on  home 02  Pt is not on blood thinners  Pt denies issues with constipation  No A fib or A flutter Have any cardiac testing pending--denied Pt instructed to use Single[employer].com or GoodRx for a price reduction on prep   

## 2021-12-20 ENCOUNTER — Encounter: Payer: Self-pay | Admitting: Certified Registered Nurse Anesthetist

## 2021-12-23 ENCOUNTER — Ambulatory Visit (AMBULATORY_SURGERY_CENTER): Payer: BC Managed Care – PPO | Admitting: Gastroenterology

## 2021-12-23 ENCOUNTER — Encounter: Payer: Self-pay | Admitting: Gastroenterology

## 2021-12-23 VITALS — BP 104/66 | HR 76 | Temp 97.1°F | Resp 16 | Ht 70.0 in | Wt 216.0 lb

## 2021-12-23 DIAGNOSIS — D124 Benign neoplasm of descending colon: Secondary | ICD-10-CM | POA: Diagnosis not present

## 2021-12-23 DIAGNOSIS — Z1211 Encounter for screening for malignant neoplasm of colon: Secondary | ICD-10-CM

## 2021-12-23 MED ORDER — SODIUM CHLORIDE 0.9 % IV SOLN: 500.0000 mL | Freq: Once | INTRAVENOUS | Status: DC

## 2021-12-23 NOTE — Progress Notes (Signed)
Called to room to assist during endoscopic procedure.  Patient ID and intended procedure confirmed with present staff. Received instructions for my participation in the procedure from the performing physician.  

## 2021-12-23 NOTE — Progress Notes (Signed)
Pt's states no medical or surgical changes since previsit or office visit. 

## 2021-12-23 NOTE — Progress Notes (Signed)
Report given to PACU, vss 

## 2021-12-23 NOTE — Progress Notes (Signed)
History and Physical:  This patient presents for endoscopic testing for: Encounter Diagnosis  Name Primary?   Special screening for malignant neoplasms, colon Yes    61 year old man here for screening colonoscopy.  Last colonoscopy October 2013 removed hyperplastic polyps without adenomatous or serrated polyps. Patient denies chronic abdominal pain, rectal bleeding, constipation or diarrhea.  Patient is otherwise without complaints or active issues today.   Past Medical History: Past Medical History:  Diagnosis Date   Anxiety    Depression    Diabetes mellitus without complication ([place])    Essential tremor    Sleep apnea    cannot tolerate cpap     Past Surgical History: Past Surgical History:  Procedure Laterality Date   CATARACT EXTRACTION W/ INTRAOCULAR LENS IMPLANT Left    CERVICAL LAMINECTOMY  2019   lumbar lamincectomy  03/16/2006   TONSILLECTOMY  03/17/1979    Allergies: Allergies  Allergen Reactions   Amoxicillin-Pot Clavulanate    Clindamycin    Effexor Xr [Venlafaxine Hcl Er] Other (See Comments)   Metformin And Related Diarrhea   Nefazodone Other (See Comments)    hallucinations   Tetracyclines & Related Nausea And Vomiting    Outpatient Meds: Current Outpatient Medications  Medication Sig Dispense Refill   ALPRAZolam (XANAX) 0.5 MG tablet Take 0.5 mg by mouth as needed.      aspirin 81 MG tablet Take 81 mg by mouth daily.     buPROPion (WELLBUTRIN XL) 300 MG 24 hr tablet Take 300 mg by mouth daily.     Continuous Blood Gluc Sensor (FREESTYLE LIBRE 2 SENSOR) MISC 1 Device by Does not apply route every 14 (fourteen) days. 6 each 3   empagliflozin (JARDIANCE) 25 MG TABS tablet Take 1 tablet (25 mg total) by mouth daily. 90 tablet 3   insulin aspart (NOVOLOG FLEXPEN) 100 UNIT/ML FlexPen Max daily 45 units 45 mL 3   insulin glargine-yfgn (SEMGLEE, YFGN,) 100 UNIT/ML injection Inject 0.5 mLs (50 Units total) into the skin daily. 45 mL 6   propranolol  (INNOPRAN XL) 80 MG 24 hr capsule Take 80 mg by mouth daily.     rosuvastatin (CRESTOR) 10 MG tablet Take 10 mg by mouth daily.     Dulaglutide (TRULICITY) 4.5 WI/2.0BT SOPN Inject 4.5 mg as directed once a week. 6 mL 3   glucose blood (ACCU-CHEK GUIDE) test strip 1 each by Other route daily. And lancets 1/day 100 each 3   Insulin Pen Needle 32G X 4 MM MISC 1 Device by Does not apply route in the morning, at noon, in the evening, and at bedtime. 400 each 3   testosterone enanthate (DELATESTRYL) 200 MG/ML injection Inject into the muscle every 14 (fourteen) days. For IM use only     traZODone (DESYREL) 100 MG tablet Take 100 mg by mouth at bedtime.     Current Facility-Administered Medications  Medication Dose Route Frequency Provider Last Rate Last Admin   0.9 %  sodium chloride infusion  500 mL Intravenous Once Doran Stabler, MD          ___________________________________________________________________ Objective   Exam:  BP 125/75   Pulse 75   Temp (!) 97.1 F (36.2 C)   Ht '5\' 10"'$  (1.778 m)   Wt 216 lb (98 kg)   SpO2 92%   BMI 30.99 kg/m   CV: Regular without murmur, S1/S2 Resp: clear to auscultation bilaterally, normal RR and effort noted GI: soft, no tenderness, with active bowel sounds.   Assessment:  Encounter Diagnosis  Name Primary?   Special screening for malignant neoplasms, colon Yes     Plan: Colonoscopy  The benefits and risks of the planned procedure were described in detail with the patient or (when appropriate) their health care proxy.  Risks were outlined as including, but not limited to, bleeding, infection, perforation, adverse medication reaction leading to cardiac or pulmonary decompensation, pancreatitis (if ERCP).  The limitation of incomplete mucosal visualization was also discussed.  No guarantees or warranties were given.    The patient is appropriate for an endoscopic procedure in the ambulatory setting.   - Wilfrid Lund, MD

## 2021-12-23 NOTE — Patient Instructions (Signed)
Handouts provided on polyps and diverticulosis.   Continue present medications. Await pathology results.   YOU HAD AN ENDOSCOPIC PROCEDURE TODAY AT [facility]:   Refer to the procedure report that was given to you for any specific questions about what was found during the examination.  If the procedure report does not answer your questions, please call your gastroenterologist to clarify.  If you requested that your care partner not be given the details of your procedure findings, then the procedure report has been included in a sealed envelope for you to review at your convenience later.  YOU SHOULD EXPECT: Some feelings of bloating in the abdomen. Passage of more gas than usual.  Walking can help get rid of the air that was put into your GI tract during the procedure and reduce the bloating. If you had a lower endoscopy (such as a colonoscopy or flexible sigmoidoscopy) you may notice spotting of blood in your stool or on the toilet paper. If you underwent a bowel prep for your procedure, you may not have a normal bowel movement for a few days.  Please Note:  You might notice some irritation and congestion in your nose or some drainage.  This is from the oxygen used during your procedure.  There is no need for concern and it should clear up in a day or so.  SYMPTOMS TO REPORT IMMEDIATELY:  Following lower endoscopy (colonoscopy or flexible sigmoidoscopy):  Excessive amounts of blood in the stool  Significant tenderness or worsening of abdominal pains  Swelling of the abdomen that is new, acute  Fever of 100F or higher  For urgent or emergent issues, a gastroenterologist can be reached at any hour by calling [phone]. Do not use MyChart messaging for urgent concerns.    DIET:  We do recommend a small meal at first, but then you may proceed to your regular diet.  Drink plenty of fluids but you should avoid alcoholic beverages for 24 hours.  ACTIVITY:  You should plan to  take it easy for the rest of today and you should NOT DRIVE or use heavy machinery until tomorrow (because of the sedation medicines used during the test).    FOLLOW UP: Our staff will call the number listed on your records the next business day following your procedure.  We will call around 7:15- 8:00 am to check on you and address any questions or concerns that you may have regarding the information given to you following your procedure. If we do not reach you, we will leave a message.     If any biopsies were taken you will be contacted by phone or by letter within the next 1-3 weeks.  Please call us at [phone] if you have not heard about the biopsies in 3 weeks.    SIGNATURES/CONFIDENTIALITY: You and/or your care partner have signed paperwork which will be entered into your electronic medical record.  These signatures attest to the fact that that the information above on your After Visit Summary has been reviewed and is understood.  Full responsibility of the confidentiality of this discharge information lies with you and/or your care-partner.

## 2021-12-23 NOTE — Op Note (Signed)
[facility] Patient Name: Steven Mays Procedure Date: 12/23/2021 7:34 AM MRN: [id number] Endoscopist: [place]. Loletha Carrow , MD Age: 61 Referring MD:  Date of Birth: 05/20/1960 Gender: Male Account #: [id number] Procedure:                Colonoscopy Indications:              Screening for colorectal malignant neoplasm                           no TA or SSP polyps last colonoscopy October 2013 Medicines:                Monitored Anesthesia Care Procedure:                Pre-Anesthesia Assessment:                           - Prior to the procedure, a History and Physical                            was performed, and patient medications and                            allergies were reviewed. The patient's tolerance of                            previous anesthesia was also reviewed. The risks                            and benefits of the procedure and the sedation                            options and risks were discussed with the patient.                            All questions were answered, and informed consent                            was obtained. Prior Anticoagulants: The patient has                            taken no previous anticoagulant or antiplatelet                            agents. ASA Grade Assessment: II - A patient with                            mild systemic disease. After reviewing the risks                            and benefits, the patient was deemed in                            satisfactory condition to undergo the procedure.  After obtaining informed consent, the colonoscope                            was passed under direct vision. Throughout the                            procedure, the patient's blood pressure, pulse, and                            oxygen saturations were monitored continuously. The                            CF [id number] #[id number] was introduced through the anus                            and advanced to the  the cecum, identified by                            appendiceal orifice and ileocecal valve. The                            colonoscopy was performed without difficulty. The                            patient tolerated the procedure well. The quality                            of the bowel preparation was good. The ileocecal                            valve, appendiceal orifice, and rectum were                            photographed. The bowel preparation used was SUPREP. Scope In: 8:05:16 AM Scope Out: 8:17:24 AM Scope Withdrawal Time: 0 hours 9 minutes 39 seconds  Total Procedure Duration: 0 hours 12 minutes 8 seconds  Findings:                 The perianal and digital rectal examinations were                            normal.                           Repeat examination of right colon under NBI                            performed.                           A diminutive polyp was found in the proximal                            descending colon. The polyp was sessile. The polyp  was removed with a cold snare. Resection and                            retrieval were complete.                           Multiple small-mouthed diverticula were found from                            distal transverse colon to sigmoid colon.                           The exam was otherwise without abnormality on                            direct and retroflexion views. Complications:            No immediate complications. Estimated Blood Loss:     Estimated blood loss was minimal. Impression:               - One diminutive polyp in the proximal descending                            colon, removed with a cold snare. Resected and                            retrieved.                           - Diverticulosis from transverse colon to sigmoid                            colon.                           - The examination was otherwise normal on direct                            and  retroflexion views. Recommendation:           - Patient has a contact number available for                            emergencies. The signs and symptoms of potential                            delayed complications were discussed with the                            patient. Return to normal activities tomorrow.                            Written discharge instructions were provided to the                            patient.                           -  Resume previous diet.                           - Continue present medications.                           - Await pathology results.                           - Repeat colonoscopy is recommended for                            surveillance. The colonoscopy date will be                            determined after pathology results from today's                            exam become available for review. Tu Shimmel L. Loletha Carrow, MD 12/23/2021 8:25:21 AM This report has been signed electronically.

## 2021-12-24 ENCOUNTER — Telehealth: Payer: Self-pay | Admitting: *Deleted

## 2021-12-24 NOTE — Telephone Encounter (Signed)
  Follow up Call-     12/23/2021    7:32 AM  Call back number  Post procedure Call Back phone  # [phone]  Permission to leave phone message Yes     Patient questions:  Do you have a fever, pain , or abdominal swelling? No. Pain Score  0 *  Have you tolerated food without any problems? Yes.    Have you been able to return to your normal activities? Yes.    Do you have any questions about your discharge instructions: Diet   No. Medications  No. Follow up visit  No.  Do you have questions or concerns about your Care? No.  Actions: * If pain score is 4 or above: No action needed, pain <4.

## 2021-12-26 DIAGNOSIS — Z23 Encounter for immunization: Secondary | ICD-10-CM | POA: Diagnosis not present

## 2021-12-30 ENCOUNTER — Encounter: Payer: Self-pay | Admitting: Gastroenterology

## 2022-04-06 NOTE — Progress Notes (Signed)
Name: TALOR CHEEMA  Age/ Sex: 62 y.o., male   MRN/ DOB: [id number], 26-Jan-1961     PCP: Gaynelle Arabian, MD   Reason for Endocrinology Evaluation: Type 2 Diabetes Mellitus  Initial Endocrine Consultative Visit: 03/22/2018    PATIENT IDENTIFIER: Steven Mays is a 62 y.o. male with a past medical history of T2DM, anxiety and depression, OSA. The patient has followed with Endocrinology clinic since 03/22/2018 for consultative assistance with management of his diabetes.  DIABETIC HISTORY:  Mr. Korver was diagnosed with DM 2016, intolerant to metformin due to diarrhea , and started insulin therapy in 2023. Marland Kitchen His hemoglobin A1c has ranged from 8.4% in 12/2020, peaking at 11.5% in 04/2021  He was followed by Dr. Loanne Drilling from January 2020 until May 2023   On his initial visit with me 10/2021, his A1c was 11.1% he was on acrobatics, bromocriptine, glimepiride, colesevelam, Semglee Jardiance and Trulicity I had stopped a carbose, bromocriptine, glimepiride, and colesevelam, increase basal insulin started him on prandial insulin, continue Jardiance and Trulicity SUBJECTIVE:   During the last visit (11/13/2021): A1c 11.1%   Today (04/07/2022): [name] is here for follow-up on diabetes management.  He checks his blood sugars multiple  times daily, through CGM. The patient has not had hypoglycemic episodes since the last clinic visit.   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, and at times he would eat a snack for lunch   HOME DIABETES REGIMEN:  Jardiance 25 mg daily Trulicity 4.5 mg weekly Semglee 50 units daily NovoLog 10 units 3 times daily before every meal Correction factor: NovoLog (BG -130/25)     Statin: Yes ACE-I/ARB: Yes     CONTINUOUS GLUCOSE MONITORING RECORD INTERPRETATION    Dates of Recording: 1/10-1/23/2024  Sensor description:freestyle libre  Results statistics:   CGM use % of time 22  Average and SD 212/34.8   Time in range  41 %  % Time Above 180 23  % Time above 250 36  % Time Below target 0   Glycemic patterns summary: BG's optimal at night and high during the day   Hyperglycemic episodes  postprandial   Hypoglycemic episodes occurred at night   Overnight periods: trends down      DIABETIC COMPLICATIONS: Microvascular complications:   Denies: CKD Last Eye Exam: Completed 06/2020  Macrovascular complications:   Denies: CAD, CVA, PVD   HISTORY:  Past Medical History:  Past Medical History:  Diagnosis Date   Anxiety    Depression    Diabetes mellitus without complication ([place])    Essential tremor    Sleep apnea    cannot tolerate cpap   Past Surgical History:  Past Surgical History:  Procedure Laterality Date   CATARACT EXTRACTION W/ INTRAOCULAR LENS IMPLANT Left    CERVICAL LAMINECTOMY  2019   lumbar lamincectomy  03/16/2006   TONSILLECTOMY  03/17/1979   Social History:  reports that he quit smoking about 15 years ago. His smoking use included cigarettes. He has never used smokeless tobacco. He reports current alcohol use of about 2.0 standard drinks of alcohol per week. He reports that he does not use drugs. Family History:  Family History  Problem Relation Age of Onset   Hypertension Other    Cancer Other    Colon cancer Neg Hx    Stomach cancer Neg Hx    Diabetes Neg Hx    Rectal cancer Neg Hx  HOME MEDICATIONS: Allergies as of 04/07/2022       Reactions   Amoxicillin-pot Clavulanate    Clindamycin    Effexor Xr [venlafaxine Hcl Er] Other (See Comments)   Metformin And Related Diarrhea   Nefazodone Other (See Comments)   hallucinations   Tetracyclines & Related Nausea And Vomiting        Medication List        Accurate as of April 07, 2022  7:38 AM. If you have any questions, ask your nurse or doctor.          Accu-Chek Guide test strip Generic drug: glucose blood 1 each by Other route daily. And lancets 1/day   ALPRAZolam  0.5 MG tablet Commonly known as: XANAX Take 0.5 mg by mouth as needed.   aspirin 81 MG tablet Take 81 mg by mouth daily.   buPROPion 300 MG 24 hr tablet Commonly known as: WELLBUTRIN XL Take 300 mg by mouth daily.   empagliflozin 25 MG Tabs tablet Commonly known as: JARDIANCE Take 1 tablet (25 mg total) by mouth daily.   FreeStyle Libre 2 Sensor Misc 1 Device by Does not apply route every 14 (fourteen) days.   insulin glargine-yfgn 100 UNIT/ML injection Commonly known as: Semglee (yfgn) Inject 0.5 mLs (50 Units total) into the skin daily.   Insulin Pen Needle 32G X 4 MM Misc 1 Device by Does not apply route in the morning, at noon, in the evening, and at bedtime.   NovoLOG FlexPen 100 UNIT/ML FlexPen Generic drug: insulin aspart Max daily 45 units   propranolol 80 MG 24 hr capsule Commonly known as: INNOPRAN XL Take 80 mg by mouth daily.   rosuvastatin 10 MG tablet Commonly known as: CRESTOR Take 10 mg by mouth daily.   testosterone enanthate 200 MG/ML injection Commonly known as: DELATESTRYL Inject into the muscle every 14 (fourteen) days. For IM use only   traZODone 100 MG tablet Commonly known as: DESYREL Take 100 mg by mouth at bedtime.   Trulicity 4.5 WG/9.5AO Sopn Generic drug: Dulaglutide Inject 4.5 mg as directed once a week.         OBJECTIVE:   Vital Signs: BP (!) 144/86 (BP Location: Left Arm, Patient Position: Sitting, Cuff Size: Normal)   Pulse 66   Ht '5\' 10"'$  (1.778 m)   Wt 222 lb (100.7 kg)   SpO2 98%   BMI 31.85 kg/m   Wt Readings from Last 3 Encounters:  04/07/22 222 lb (100.7 kg)  12/23/21 216 lb (98 kg)  12/16/21 216 lb (98 kg)     Exam: General: Pt appears well and is in NAD  Lungs: Clear with good BS bilat with no rales, rhonchi, or wheezes  Heart: RRR   Abdomen: soft, nontender  Extremities: No pretibial edema.   Neuro: MS is good with appropriate affect, pt is alert and Ox3     DM Foot Exam 04/07/2022   The skin of  the feet is intact without sores or ulcerations. The pedal pulses are 2+ on right and 2+ on left. The sensation is intact to a screening 5.07, 10 gram monofilament bilaterally     DATA REVIEWED:  Lab Results  Component Value Date   HGBA1C 7.0 (A) 04/07/2022   HGBA1C 11.1 (A) 11/13/2021   HGBA1C 9.6 (A) 07/17/2021    10/09/2021 GFR 99 BUN/CR 18/0.85 HDL 42 LDL 125 MA/CR 0.7 TG 169  ASSESSMENT / PLAN / RECOMMENDATIONS:   1) Type 2 Diabetes Mellitus, Optimally controlled, Without  complications - Most recent A1c of 7.0 %. Goal A1c < 7.0 %.     - Praised the pt on improved glycemic control from 11.1% to 7.0% -Patient endorses pain with Trulicity injections, we discussed switching to Ozempic, or mOunjaro , will try Mounjaro first, coupon provided  -CeQur  sent to [facility] -He was instructed on taking prandial dose of insulin with each meal plus using a correction scale that will be provided today - He was advised to contact the office should he recive the [facility] , so I can set him up for training    MEDICATIONS:  Continue Jardiance 25 mg daily Decrease Semglee to 46 units daily Increase NovoLog 12 units 3 times daily before every meal Continue correction factor : NovoLog (BG -130/25) Will try switching Trulicity to mounjaro 5 mg weekly   EDUCATION / INSTRUCTIONS: BG monitoring instructions: Patient is instructed to check his blood sugars 3 times a day, before meals. Call [facility] if: BG persistently < 70  I reviewed the Rule of 15 for the treatment of hypoglycemia in detail with the patient. Literature supplied.   2) Diabetic complications:  Eye: Does not at have known diabetic retinopathy.  Neuro/ Feet: Does not have known diabetic peripheral neuropathy .  Renal: Patient does not have known baseline CKD. He   is not on an ACEI/ARB at present.     F/U in 4 months   Signed electronically by: Mack Guise, MD  [facility]   [facility] [address] [place], [place] [address] Phone: [phone] FAX: [phone]   CC: Gaynelle Arabian, MD [address] [place] [address] Phone: [phone]  Fax: [phone]  Return to Endocrinology clinic as below: No future appointments.

## 2022-04-07 ENCOUNTER — Ambulatory Visit: Payer: BC Managed Care – PPO | Admitting: Internal Medicine

## 2022-04-07 ENCOUNTER — Encounter: Payer: Self-pay | Admitting: Internal Medicine

## 2022-04-07 VITALS — BP 144/86 | HR 66 | Ht 70.0 in | Wt 222.0 lb

## 2022-04-07 DIAGNOSIS — E119 Type 2 diabetes mellitus without complications: Secondary | ICD-10-CM | POA: Diagnosis not present

## 2022-04-07 LAB — POCT GLYCOSYLATED HEMOGLOBIN (HGB A1C): Hemoglobin A1C: 7 % — AB (ref 4.0–5.6)

## 2022-04-07 MED ORDER — CEQUR SIMPLICITY INSERTER MISC: 1.0000 | 0 refills | Status: DC

## 2022-04-07 MED ORDER — INSULIN GLARGINE-YFGN 100 UNIT/ML ~~LOC~~ SOLN: 46.0000 [IU] | Freq: Every day | SUBCUTANEOUS | 6 refills | Status: DC

## 2022-04-07 MED ORDER — CEQUR SIMPLICITY 2U DEVI: 1.0000 | Freq: Once | 3 refills | Status: AC

## 2022-04-07 MED ORDER — TIRZEPATIDE 5 MG/0.5ML ~~LOC~~ SOAJ: 5.0000 mg | SUBCUTANEOUS | 3 refills | Status: DC

## 2022-04-07 NOTE — Patient Instructions (Addendum)
Will try switching Trulicity to [employer] 5 mg weekly  Continue Jardiance 25 mg daily  Increase Novolog 12 units with each meal ( as a baseline ) Decrease Semglee to 46 units ONCE daily  Novolog correctional insulin: ADD extra units on insulin to your meal-time Novolog dose if your blood sugars are higher than 155. Use the scale below to help guide you:   Blood sugar before meal Number of units to inject  Less than 155 0 unit  156 -  180 1 units  181 -  205 2 units  206 -  230 3 units  231 -  255 4 units  256 -  280 5 units  281 -  305 6 units  306 -  330 7 units  331 -  355 8 units  356 - 380 9 units  381 - 405 10 units   406 - 430 11 units     HOW TO TREAT LOW BLOOD SUGARS (Blood sugar LESS THAN 70 MG/DL) Please follow the RULE OF 15 for the treatment of hypoglycemia treatment (when your (blood sugars are less than 70 mg/dL)   STEP 1: Take 15 grams of carbohydrates when your blood sugar is low, which includes:  3-4 GLUCOSE TABS  OR 3-4 OZ OF JUICE OR REGULAR SODA OR ONE TUBE OF GLUCOSE GEL    STEP 2: RECHECK blood sugar in 15 MINUTES STEP 3: If your blood sugar is still low at the 15 minute recheck --> then, go back to STEP 1 and treat AGAIN with another 15 grams of carbohydrates.

## 2022-04-16 DIAGNOSIS — I10 Essential (primary) hypertension: Secondary | ICD-10-CM | POA: Diagnosis not present

## 2022-04-16 DIAGNOSIS — E1165 Type 2 diabetes mellitus with hyperglycemia: Secondary | ICD-10-CM | POA: Diagnosis not present

## 2022-04-16 DIAGNOSIS — E781 Pure hyperglyceridemia: Secondary | ICD-10-CM | POA: Diagnosis not present

## 2022-04-16 DIAGNOSIS — E291 Testicular hypofunction: Secondary | ICD-10-CM | POA: Diagnosis not present

## 2022-05-13 ENCOUNTER — Encounter (HOSPITAL_COMMUNITY): Payer: Self-pay

## 2022-05-13 ENCOUNTER — Emergency Department (HOSPITAL_COMMUNITY): Payer: BC Managed Care – PPO | Admitting: Certified Registered Nurse Anesthetist

## 2022-05-13 ENCOUNTER — Ambulatory Visit (HOSPITAL_COMMUNITY)
Admission: EM | Admit: 2022-05-13 | Discharge: 2022-05-13 | Disposition: A | Discharge status: Home / Self Care | Payer: BC Managed Care – PPO | Attending: Emergency Medicine | Admitting: Emergency Medicine

## 2022-05-13 ENCOUNTER — Encounter (HOSPITAL_COMMUNITY)
Admission: EM | Disposition: A | Discharge status: Home / Self Care | Payer: Self-pay | Source: Home / Self Care | Attending: Emergency Medicine

## 2022-05-13 ENCOUNTER — Other Ambulatory Visit: Payer: Self-pay

## 2022-05-13 DIAGNOSIS — R1031 Right lower quadrant pain: Secondary | ICD-10-CM | POA: Diagnosis not present

## 2022-05-13 DIAGNOSIS — F17210 Nicotine dependence, cigarettes, uncomplicated: Secondary | ICD-10-CM | POA: Insufficient documentation

## 2022-05-13 DIAGNOSIS — F32A Depression, unspecified: Secondary | ICD-10-CM | POA: Diagnosis not present

## 2022-05-13 DIAGNOSIS — G250 Essential tremor: Secondary | ICD-10-CM | POA: Insufficient documentation

## 2022-05-13 DIAGNOSIS — F419 Anxiety disorder, unspecified: Secondary | ICD-10-CM | POA: Insufficient documentation

## 2022-05-13 DIAGNOSIS — I10 Essential (primary) hypertension: Secondary | ICD-10-CM | POA: Diagnosis not present

## 2022-05-13 DIAGNOSIS — K37 Unspecified appendicitis: Secondary | ICD-10-CM | POA: Diagnosis not present

## 2022-05-13 DIAGNOSIS — R109 Unspecified abdominal pain: Secondary | ICD-10-CM | POA: Diagnosis not present

## 2022-05-13 DIAGNOSIS — K3580 Unspecified acute appendicitis: Secondary | ICD-10-CM | POA: Diagnosis not present

## 2022-05-13 DIAGNOSIS — E119 Type 2 diabetes mellitus without complications: Secondary | ICD-10-CM | POA: Insufficient documentation

## 2022-05-13 DIAGNOSIS — E873 Alkalosis: Secondary | ICD-10-CM | POA: Diagnosis not present

## 2022-05-13 DIAGNOSIS — Z79899 Other long term (current) drug therapy: Secondary | ICD-10-CM | POA: Diagnosis not present

## 2022-05-13 DIAGNOSIS — K3531 Acute appendicitis with localized peritonitis and gangrene, without perforation: Secondary | ICD-10-CM | POA: Diagnosis not present

## 2022-05-13 DIAGNOSIS — K388 Other specified diseases of appendix: Secondary | ICD-10-CM | POA: Diagnosis not present

## 2022-05-13 DIAGNOSIS — G4730 Sleep apnea, unspecified: Secondary | ICD-10-CM | POA: Insufficient documentation

## 2022-05-13 HISTORY — PX: LAPAROSCOPIC APPENDECTOMY: SHX408

## 2022-05-13 LAB — GLUCOSE, CAPILLARY
Glucose-Capillary: 207 mg/dL — ABNORMAL HIGH (ref 70–99)
Glucose-Capillary: 222 mg/dL — ABNORMAL HIGH (ref 70–99)

## 2022-05-13 SURGERY — APPENDECTOMY, LAPAROSCOPIC
Anesthesia: General

## 2022-05-13 MED ORDER — SCOPOLAMINE 1 MG/3DAYS TD PT72
1.0000 | MEDICATED_PATCH | TRANSDERMAL | Status: DC
  Administered 2022-05-13: 1.5 mg via TRANSDERMAL

## 2022-05-13 MED ORDER — KETOROLAC TROMETHAMINE 30 MG/ML IJ SOLN
INTRAMUSCULAR | Status: DC | PRN
  Administered 2022-05-13: 30 mg via INTRAVENOUS

## 2022-05-13 MED ORDER — GABAPENTIN 300 MG PO CAPS
300.0000 mg | ORAL_CAPSULE | ORAL | Status: AC
  Administered 2022-05-13: 300 mg via ORAL

## 2022-05-13 MED ORDER — PROPOFOL 10 MG/ML IV BOLUS
INTRAVENOUS | Status: AC
  Filled 2022-05-13: qty 20

## 2022-05-13 MED ORDER — CIPROFLOXACIN HCL 500 MG PO TABS: 500.0000 mg | ORAL_TABLET | Freq: Two times a day (BID) | ORAL | 0 refills | Status: DC

## 2022-05-13 MED ORDER — CELECOXIB 200 MG PO CAPS
200.0000 mg | ORAL_CAPSULE | ORAL | Status: AC
  Administered 2022-05-13: 200 mg via ORAL

## 2022-05-13 MED ORDER — SCOPOLAMINE 1 MG/3DAYS TD PT72
MEDICATED_PATCH | TRANSDERMAL | Status: AC
  Filled 2022-05-13: qty 1

## 2022-05-13 MED ORDER — SODIUM CHLORIDE 0.9 % IR SOLN
Status: DC | PRN
  Administered 2022-05-13: 1000 mL

## 2022-05-13 MED ORDER — METRONIDAZOLE 500 MG PO TABS: 500.0000 mg | ORAL_TABLET | Freq: Four times a day (QID) | ORAL | 0 refills | Status: AC

## 2022-05-13 MED ORDER — HYDROCODONE-ACETAMINOPHEN 5-325 MG PO TABS: 1.0000 | ORAL_TABLET | Freq: Four times a day (QID) | ORAL | 0 refills | Status: AC | PRN

## 2022-05-13 MED ORDER — HYDROMORPHONE HCL 1 MG/ML IJ SOLN
1.0000 mg | Freq: Once | INTRAMUSCULAR | Status: AC
  Administered 2022-05-13: 1 mg via INTRAVENOUS
  Filled 2022-05-13: qty 1

## 2022-05-13 MED ORDER — METRONIDAZOLE 500 MG/100ML IV SOLN: 500.0000 mg | INTRAVENOUS | Status: DC

## 2022-05-13 MED ORDER — PHENYLEPHRINE 80 MCG/ML (10ML) SYRINGE FOR IV PUSH (FOR BLOOD PRESSURE SUPPORT)
PREFILLED_SYRINGE | INTRAVENOUS | Status: AC
  Filled 2022-05-13: qty 20

## 2022-05-13 MED ORDER — CHLORHEXIDINE GLUCONATE CLOTH 2 % EX PADS: 6.0000 | MEDICATED_PAD | Freq: Once | CUTANEOUS | Status: DC

## 2022-05-13 MED ORDER — ALPRAZOLAM 0.5 MG PO TABS: 0.5000 mg | ORAL_TABLET | Freq: Two times a day (BID) | ORAL | Status: DC | PRN

## 2022-05-13 MED ORDER — BUPIVACAINE-EPINEPHRINE (PF) 0.25% -1:200000 IJ SOLN
INTRAMUSCULAR | Status: AC
  Filled 2022-05-13: qty 30

## 2022-05-13 MED ORDER — TRAZODONE HCL 100 MG PO TABS: 100.0000 mg | ORAL_TABLET | Freq: Every day | ORAL | Status: DC

## 2022-05-13 MED ORDER — CELECOXIB 200 MG PO CAPS
ORAL_CAPSULE | ORAL | Status: AC
  Filled 2022-05-13: qty 1

## 2022-05-13 MED ORDER — BUPROPION HCL ER (XL) 150 MG PO TB24: 300.0000 mg | ORAL_TABLET | Freq: Every day | ORAL | Status: DC

## 2022-05-13 MED ORDER — INSULIN ASPART 100 UNIT/ML IJ SOLN
8.0000 [IU] | Freq: Once | INTRAMUSCULAR | Status: AC
  Administered 2022-05-13: 8 [IU] via SUBCUTANEOUS

## 2022-05-13 MED ORDER — INSULIN ASPART 100 UNIT/ML IJ SOLN
0.0000 [IU] | Freq: Every day | INTRAMUSCULAR | Status: DC
  Filled 2022-05-13: qty 0.05

## 2022-05-13 MED ORDER — INSULIN ASPART 100 UNIT/ML IJ SOLN
0.0000 [IU] | Freq: Three times a day (TID) | INTRAMUSCULAR | Status: DC
  Filled 2022-05-13: qty 0.15

## 2022-05-13 MED ORDER — METRONIDAZOLE 500 MG/100ML IV SOLN
INTRAVENOUS | Status: AC
  Filled 2022-05-13: qty 100

## 2022-05-13 MED ORDER — PROPOFOL 10 MG/ML IV BOLUS
INTRAVENOUS | Status: DC | PRN
  Administered 2022-05-13: 180 mg via INTRAVENOUS

## 2022-05-13 MED ORDER — LIDOCAINE 2% (20 MG/ML) 5 ML SYRINGE
INTRAMUSCULAR | Status: DC | PRN
  Administered 2022-05-13: 100 mg via INTRAVENOUS

## 2022-05-13 MED ORDER — PROCHLORPERAZINE EDISYLATE 10 MG/2ML IJ SOLN
10.0000 mg | Freq: Once | INTRAMUSCULAR | Status: AC
  Administered 2022-05-13: 10 mg via INTRAVENOUS
  Filled 2022-05-13: qty 2

## 2022-05-13 MED ORDER — FENTANYL CITRATE (PF) 250 MCG/5ML IJ SOLN
INTRAMUSCULAR | Status: AC
  Filled 2022-05-13: qty 5

## 2022-05-13 MED ORDER — CIPROFLOXACIN IN D5W 400 MG/200ML IV SOLN
INTRAVENOUS | Status: DC | PRN
  Administered 2022-05-13: 400 mg via INTRAVENOUS

## 2022-05-13 MED ORDER — LACTATED RINGERS IV SOLN: INTRAVENOUS | Status: DC | PRN

## 2022-05-13 MED ORDER — BUPIVACAINE LIPOSOME 1.3 % IJ SUSP
INTRAMUSCULAR | Status: AC
  Filled 2022-05-13: qty 20

## 2022-05-13 MED ORDER — ONDANSETRON HCL 4 MG/2ML IJ SOLN
INTRAMUSCULAR | Status: DC | PRN
  Administered 2022-05-13: 4 mg via INTRAVENOUS

## 2022-05-13 MED ORDER — INSULIN ASPART 100 UNIT/ML IJ SOLN
INTRAMUSCULAR | Status: AC
  Filled 2022-05-13: qty 1

## 2022-05-13 MED ORDER — GABAPENTIN 300 MG PO CAPS
ORAL_CAPSULE | ORAL | Status: AC
  Filled 2022-05-13: qty 1

## 2022-05-13 MED ORDER — AMISULPRIDE (ANTIEMETIC) 5 MG/2ML IV SOLN: 10.0000 mg | Freq: Once | INTRAVENOUS | Status: DC | PRN

## 2022-05-13 MED ORDER — ROSUVASTATIN CALCIUM 20 MG PO TABS: 10.0000 mg | ORAL_TABLET | Freq: Every day | ORAL | Status: DC

## 2022-05-13 MED ORDER — FENTANYL CITRATE PF 50 MCG/ML IJ SOSY: 25.0000 ug | PREFILLED_SYRINGE | INTRAMUSCULAR | Status: DC | PRN

## 2022-05-13 MED ORDER — MIDAZOLAM HCL 2 MG/2ML IJ SOLN
INTRAMUSCULAR | Status: AC
  Filled 2022-05-13: qty 2

## 2022-05-13 MED ORDER — CIPROFLOXACIN IN D5W 400 MG/200ML IV SOLN: 400.0000 mg | INTRAVENOUS | Status: DC

## 2022-05-13 MED ORDER — BUPIVACAINE-EPINEPHRINE 0.25% -1:200000 IJ SOLN
INTRAMUSCULAR | Status: DC | PRN
  Administered 2022-05-13: 30 mL

## 2022-05-13 MED ORDER — DEXAMETHASONE SODIUM PHOSPHATE 10 MG/ML IJ SOLN
INTRAMUSCULAR | Status: DC | PRN
  Administered 2022-05-13: 10 mg via INTRAVENOUS

## 2022-05-13 MED ORDER — ACETAMINOPHEN 500 MG PO TABS
ORAL_TABLET | ORAL | Status: AC
  Filled 2022-05-13: qty 2

## 2022-05-13 MED ORDER — FENTANYL CITRATE (PF) 100 MCG/2ML IJ SOLN
INTRAMUSCULAR | Status: DC | PRN
  Administered 2022-05-13: 100 ug via INTRAVENOUS
  Administered 2022-05-13: 50 ug via INTRAVENOUS

## 2022-05-13 MED ORDER — BUPIVACAINE LIPOSOME 1.3 % IJ SUSP: 20.0000 mL | Freq: Once | INTRAMUSCULAR | Status: DC

## 2022-05-13 MED ORDER — BUPIVACAINE LIPOSOME 1.3 % IJ SUSP
INTRAMUSCULAR | Status: DC | PRN
  Administered 2022-05-13: 20 mL

## 2022-05-13 MED ORDER — MIDAZOLAM HCL 5 MG/5ML IJ SOLN
INTRAMUSCULAR | Status: DC | PRN
  Administered 2022-05-13: 2 mg via INTRAVENOUS

## 2022-05-13 MED ORDER — ACETAMINOPHEN 500 MG PO TABS: 1000.0000 mg | ORAL_TABLET | ORAL | Status: DC

## 2022-05-13 MED ORDER — 0.9 % SODIUM CHLORIDE (POUR BTL) OPTIME
TOPICAL | Status: DC | PRN
  Administered 2022-05-13: 1000 mL

## 2022-05-13 MED ORDER — METRONIDAZOLE 500 MG/100ML IV SOLN
INTRAVENOUS | Status: DC | PRN
  Administered 2022-05-13: 500 mg via INTRAVENOUS

## 2022-05-13 MED ORDER — CIPROFLOXACIN IN D5W 400 MG/200ML IV SOLN
INTRAVENOUS | Status: AC
  Filled 2022-05-13: qty 200

## 2022-05-13 MED ORDER — PHENYLEPHRINE 80 MCG/ML (10ML) SYRINGE FOR IV PUSH (FOR BLOOD PRESSURE SUPPORT)
PREFILLED_SYRINGE | INTRAVENOUS | Status: DC | PRN
  Administered 2022-05-13: 80 ug via INTRAVENOUS
  Administered 2022-05-13 (×2): 160 ug via INTRAVENOUS

## 2022-05-13 MED ORDER — ASPIRIN 81 MG PO TABS: 81.0000 mg | ORAL_TABLET | Freq: Every day | ORAL | Status: DC

## 2022-05-13 MED ORDER — SUCCINYLCHOLINE CHLORIDE 200 MG/10ML IV SOSY
PREFILLED_SYRINGE | INTRAVENOUS | Status: DC | PRN
  Administered 2022-05-13: 160 mg via INTRAVENOUS

## 2022-05-13 MED ORDER — PROMETHAZINE HCL 25 MG/ML IJ SOLN: 6.2500 mg | INTRAMUSCULAR | Status: DC | PRN

## 2022-05-13 MED ORDER — ROCURONIUM BROMIDE 10 MG/ML (PF) SYRINGE
PREFILLED_SYRINGE | INTRAVENOUS | Status: DC | PRN
  Administered 2022-05-13: 30 mg via INTRAVENOUS
  Administered 2022-05-13: 10 mg via INTRAVENOUS

## 2022-05-13 MED ORDER — ACETAMINOPHEN 500 MG PO TABS
1000.0000 mg | ORAL_TABLET | Freq: Once | ORAL | Status: AC
  Administered 2022-05-13: 1000 mg via ORAL

## 2022-05-13 MED ORDER — SUGAMMADEX SODIUM 200 MG/2ML IV SOLN
INTRAVENOUS | Status: DC | PRN
  Administered 2022-05-13: 200 mg via INTRAVENOUS

## 2022-05-13 SURGICAL SUPPLY — 61 items
APL PRP STRL LF DISP 70% ISPRP (MISCELLANEOUS) ×2
APPLIER CLIP 5 13 M/L LIGAMAX5 (MISCELLANEOUS) ×1
APPLIER CLIP ROT 10 11.4 M/L (STAPLE)
APR CLP MED LRG 11.4X10 (STAPLE)
APR CLP MED LRG 5 ANG JAW (MISCELLANEOUS) ×1
BAG COUNTER SPONGE SURGICOUNT (BAG) IMPLANT
BAG SPEC RTRVL 10 TROC 200 (ENDOMECHANICALS) ×1
BAG SPNG CNTER NS LX DISP (BAG)
CABLE HIGH FREQUENCY MONO STRZ (ELECTRODE) ×1 IMPLANT
CHLORAPREP W/TINT 26 (MISCELLANEOUS) ×1 IMPLANT
CLIP APPLIE 5 13 M/L LIGAMAX5 (MISCELLANEOUS) IMPLANT
CLIP APPLIE ROT 10 11.4 M/L (STAPLE) IMPLANT
COVER SURGICAL LIGHT HANDLE (MISCELLANEOUS) ×1 IMPLANT
DEVICE TROCAR PUNCTURE CLOSURE (ENDOMECHANICALS) IMPLANT
DRAPE LAPAROSCOPIC ABDOMINAL (DRAPES) ×1 IMPLANT
DRAPE WARM FLUID 44X44 (DRAPES) ×1 IMPLANT
DRSG TEGADERM 2-3/8X2-3/4 SM (GAUZE/BANDAGES/DRESSINGS) ×2 IMPLANT
DRSG TEGADERM 4X4.75 (GAUZE/BANDAGES/DRESSINGS) ×1 IMPLANT
ELECT REM PT RETURN 15FT ADLT (MISCELLANEOUS) ×1 IMPLANT
ENDOLOOP SUT PDS II  0 18 (SUTURE)
ENDOLOOP SUT PDS II 0 18 (SUTURE) IMPLANT
GAUZE SPONGE 2X2 8PLY STRL LF (GAUZE/BANDAGES/DRESSINGS) IMPLANT
GAUZE SPONGE 2X2 STRL 8-PLY (GAUZE/BANDAGES/DRESSINGS) ×1 IMPLANT
GLOVE ECLIPSE 8.0 STRL XLNG CF (GLOVE) ×1 IMPLANT
GLOVE INDICATOR 8.0 STRL GRN (GLOVE) ×1 IMPLANT
GOWN STRL REUS W/ TWL XL LVL3 (GOWN DISPOSABLE) ×1 IMPLANT
GOWN STRL REUS W/TWL XL LVL3 (GOWN DISPOSABLE) ×1
IRRIG SUCT STRYKERFLOW 2 WTIP (MISCELLANEOUS) ×1
IRRIGATION SUCT STRKRFLW 2 WTP (MISCELLANEOUS) ×1 IMPLANT
KIT BASIN OR (CUSTOM PROCEDURE TRAY) ×1 IMPLANT
KIT TURNOVER KIT A (KITS) IMPLANT
NDL INSUFFLATION 14GA 120MM (NEEDLE) IMPLANT
NEEDLE INSUFFLATION 14GA 120MM (NEEDLE) IMPLANT
PAD POSITIONING PINK XL (MISCELLANEOUS) ×1 IMPLANT
PENCIL SMOKE EVACUATOR (MISCELLANEOUS) IMPLANT
POUCH RETRIEVAL ECOSAC 10 (ENDOMECHANICALS) ×1 IMPLANT
POUCH RETRIEVAL ECOSAC 10MM (ENDOMECHANICALS) ×1
RELOAD STAPLE 60 3.6 BLU REG (STAPLE) IMPLANT
RELOAD STAPLE 60 4.1 GRN THCK (STAPLE) IMPLANT
RELOAD STAPLER BLUE 60MM (STAPLE) IMPLANT
RELOAD STAPLER GREEN 60MM (STAPLE) IMPLANT
SCISSORS LAP 5X35 DISP (ENDOMECHANICALS) ×1 IMPLANT
SEALER TISSUE G2 STRG ARTC 35C (ENDOMECHANICALS) IMPLANT
SET TUBE SMOKE EVAC HIGH FLOW (TUBING) ×1 IMPLANT
SLEEVE Z-THREAD 5X100MM (TROCAR) ×1 IMPLANT
SPIKE FLUID TRANSFER (MISCELLANEOUS) ×1 IMPLANT
STAPLE ECHEON FLEX 60 POW ENDO (STAPLE) IMPLANT
STAPLER RELOAD BLUE 60MM (STAPLE)
STAPLER RELOAD GREEN 60MM (STAPLE)
SUT MNCRL AB 4-0 PS2 18 (SUTURE) ×1 IMPLANT
SUT PDS AB 0 CT1 36 (SUTURE) IMPLANT
SUT PDS AB 1 CT1 27 (SUTURE) IMPLANT
SUT SILK 2 0 SH (SUTURE) IMPLANT
TOWEL OR 17X26 10 PK STRL BLUE (TOWEL DISPOSABLE) ×1 IMPLANT
TRAY FOLEY MTR SLVR 14FR STAT (SET/KITS/TRAYS/PACK) ×1 IMPLANT
TRAY FOLEY MTR SLVR 16FR STAT (SET/KITS/TRAYS/PACK) IMPLANT
TRAY LAPAROSCOPIC (CUSTOM PROCEDURE TRAY) ×1 IMPLANT
TROCAR ADV FIXATION 12X100MM (TROCAR) IMPLANT
TROCAR ADV FIXATION 5X100MM (TROCAR) ×1 IMPLANT
TROCAR Z THREAD OPTICAL 12X100 (TROCAR) IMPLANT
TROCAR Z-THREAD OPTICAL 5X100M (TROCAR) ×1 IMPLANT

## 2022-05-13 NOTE — Discharge Instructions (Signed)
SURGERY: POST OP INSTRUCTIONS (Surgery for small bowel obstruction, colon resection, etc)   ######################################################################  EAT Gradually transition to a high fiber diet with a fiber supplement over the next few days after discharge  WALK Walk an hour a day.  Control your pain to do that.    CONTROL PAIN Control pain so that you can walk, sleep, tolerate sneezing/coughing, go up/down stairs.  HAVE A BOWEL MOVEMENT DAILY Keep your bowels regular to avoid problems.  OK to try a laxative to override constipation.  OK to use an antidairrheal to slow down diarrhea.  Call if not better after 2 tries  CALL IF YOU HAVE PROBLEMS/CONCERNS Call if you are still struggling despite following these instructions. Call if you have concerns not answered by these instructions  ######################################################################   DIET Follow a light diet the first few days at home.  Start with a bland diet such as soups, liquids, starchy foods, low fat foods, etc.  If you feel full, bloated, or constipated, stay on a ful liquid or pureed/blenderized diet for a few days until you feel better and no longer constipated. Be sure to drink plenty of fluids every day to avoid getting dehydrated (feeling dizzy, not urinating, etc.). Gradually add a fiber supplement to your diet over the next week.  Gradually get back to a regular solid diet.  Avoid fast food or heavy meals the first week as you are more likely to get nauseated. It is expected for your digestive tract to need a few months to get back to normal.  It is common for your bowel movements and stools to be irregular.  You will have occasional bloating and cramping that should eventually fade away.  Until you are eating solid food normally, off all pain medications, and back to regular activities; your bowels will not be normal. Focus on eating a low-fat, high fiber diet the rest of your life  (See Getting to Good Bowel Health, below).  CARE of your INCISION or WOUND  It is good for closed incisions and even open wounds to be washed every day.  Shower every day.  Short baths are fine.  Wash the incisions and wounds clean with soap & water.    You may leave closed incisions open to air if it is dry.   You may cover the incision with clean gauze & replace it after your daily shower for comfort.  TEGADERM:  You have clear gauze band-aid dressings over your closed incision(s).  Remove the dressings 3 days after surgery.    If you have an open wound with a wound vac, see wound vac care instructions.    ACTIVITIES as tolerated Start light daily activities --- self-care, walking, climbing stairs-- beginning the day after surgery.  Gradually increase activities as tolerated.  Control your pain to be active.  Stop when you are tired.  Ideally, walk several times a day, eventually an hour a day.   Most people are back to most day-to-day activities in a few weeks.  It takes 4-8 weeks to get back to unrestricted, intense activity. If you can walk 30 minutes without difficulty, it is safe to try more intense activity such as jogging, treadmill, bicycling, low-impact aerobics, swimming, etc. Save the most intensive and strenuous activity for last (Usually 4-8 weeks after surgery) such as sit-ups, heavy lifting, contact sports, etc.  Refrain from any intense heavy lifting or straining until you are off narcotics for pain control.  You will have off days, but   things should improve week-by-week. DO NOT PUSH THROUGH PAIN.  Let pain be your guide: If it hurts to do something, don't do it.  Pain is your body warning you to avoid that activity for another week until the pain goes down. You may drive when you are no longer taking narcotic prescription pain medication, you can comfortably wear a seatbelt, and you can safely make sudden turns/stops to protect yourself without hesitating due to pain. You may  have sexual intercourse when it is comfortable. If it hurts to do something, stop.  MEDICATIONS Take your usually prescribed home medications unless otherwise directed.   Blood thinners:  Usually you can restart any strong blood thinners after the second postoperative day.  It is OK to take aspirin right away.     If you are on strong blood thinners (warfarin/Coumadin, Plavix, Xerelto, Eliquis, Pradaxa, etc), discuss with your surgeon, medicine PCP, and/or cardiologist for instructions on when to restart the blood thinner & if blood monitoring is needed (PT/INR blood check, etc).     PAIN CONTROL Pain after surgery or related to activity is often due to strain/injury to muscle, tendon, nerves and/or incisions.  This pain is usually short-term and will improve in a few months.  To help speed the process of healing and to get back to regular activity more quickly, DO THE FOLLOWING THINGS TOGETHER: Increase activity gradually.  DO NOT PUSH THROUGH PAIN Use Ice and/or Heat Try Gentle Massage and/or Stretching Take over the counter pain medication Take Narcotic prescription pain medication for more severe pain  Good pain control = faster recovery.  It is better to take more medicine to be more active than to stay in bed all day to avoid medications.  Increase activity gradually Avoid heavy lifting at first, then increase to lifting as tolerated over the next 6 weeks. Do not "push through" the pain.  Listen to your body and avoid positions and maneuvers than reproduce the pain.  Wait a few days before trying something more intense Walking an hour a day is encouraged to help your body recover faster and more safely.  Start slowly and stop when getting sore.  If you can walk 30 minutes without stopping or pain, you can try more intense activity (running, jogging, aerobics, cycling, swimming, treadmill, sex, sports, weightlifting, etc.) Remember: If it hurts to do it, then don't do it! Use Ice and/or  Heat You will have swelling and bruising around the incisions.  This will take several weeks to resolve. Ice packs or heating pads (6-8 times a day, 30-60 minutes at a time) will help sooth soreness & bruising. Some people prefer to use ice alone, heat alone, or alternate between ice & heat.  Experiment and see what works best for you.  Consider trying ice for the first few days to help decrease swelling and bruising; then, switch to heat to help relax sore spots and speed recovery. Shower every day.  Short baths are fine.  It feels good!  Keep the incisions and wounds clean with soap & water.   Try Gentle Massage and/or Stretching Massage at the area of pain many times a day Stop if you feel pain - do not overdo it Take over the counter pain medication This helps the muscle and nerve tissues become less irritable and calm down faster Choose ONE of the following over-the-counter anti-inflammatory medications: Acetaminophen 500mg tabs (Tylenol) 1-2 pills with every meal and just before bedtime (avoid if you have liver problems or   if you have acetaminophen in you narcotic prescription) Naproxen 220mg tabs (ex. Aleve, Naprosyn) 1-2 pills twice a day (avoid if you have kidney, stomach, IBD, or bleeding problems) Ibuprofen 200mg tabs (ex. Advil, Motrin) 3-4 pills with every meal and just before bedtime (avoid if you have kidney, stomach, IBD, or bleeding problems) Take with food/snack several times a day as directed for at least 2 weeks to help keep pain / soreness down & more manageable. Take Narcotic prescription pain medication for more severe pain A prescription for strong pain control is often given to you upon discharge (for example: oxycodone/Percocet, hydrocodone/Norco/Vicodin, or tramadol/Ultram) Take your pain medication as prescribed. Be mindful that most narcotic prescriptions contain Tylenol (acetaminophen) as well - avoid taking too much Tylenol. If you are having problems/concerns with  the prescription medicine (does not control pain, nausea, vomiting, rash, itching, etc.), please call us [phone] to see if we need to switch you to a different pain medicine that will work better for you and/or control your side effects better. If you need a refill on your pain medication, you must call the office before 4 pm and on weekdays only.  By federal law, prescriptions for narcotics cannot be called into a pharmacy.  They must be filled out on paper & picked up from our office by the patient or authorized caretaker.  Prescriptions cannot be filled after 4 pm nor on weekends.    WHEN TO CALL US [phone] Severe uncontrolled or worsening pain  Fever over 101 F (38.5 C) Concerns with the incision: Worsening pain, redness, rash/hives, swelling, bleeding, or drainage Reactions / problems with new medications (itching, rash, hives, nausea, etc.) Nausea and/or vomiting Difficulty urinating Difficulty breathing Worsening fatigue, dizziness, lightheadedness, blurred vision Other concerns If you are not getting better after two weeks or are noticing you are getting worse, contact our office [phone] for further advice.  We may need to adjust your medications, re-evaluate you in the office, send you to the emergency room, or see what other things we can do to help. The clinic staff is available to answer your questions during regular business hours (8:30am-5pm).  Please don't hesitate to call and ask to speak to one of our nurses for clinical concerns.    A surgeon from [facility] is always on call at the hospitals 24 hours/day If you have a medical emergency, go to the nearest emergency room or call 911.  FOLLOW UP in our office One the day of your discharge from the hospital (or the next business weekday), please call [facility] to set up or confirm an appointment to see your surgeon in the office for a follow-up appointment.  Usually it is 2-3 weeks  after your surgery.   If you have skin staples at your incision(s), let the office know so we can set up a time in the office for the nurse to remove them (usually around 10 days after surgery). Make sure that you call for appointments the day of discharge (or the next business weekday) from the hospital to ensure a convenient appointment time. IF YOU HAVE DISABILITY OR FAMILY LEAVE FORMS, BRING THEM TO THE OFFICE FOR PROCESSING.  DO NOT GIVE THEM TO YOUR DOCTOR.  [facility] [address], [address], [place], [place]  [address] ? [phone] - Main [phone] - Toll Free,  [phone] - Fax [email or web address]    GETTING TO GOOD BOWEL HEALTH. It is expected for your   digestive tract to need a few months to get back to normal.  It is common for your bowel movements and stools to be irregular.  You will have occasional bloating and cramping that should eventually fade away.  Until you are eating solid food normally, off all pain medications, and back to regular activities; your bowels will not be normal.   Avoiding constipation The goal: ONE SOFT BOWEL MOVEMENT A DAY!    Drink plenty of fluids.  Choose water first. TAKE A FIBER SUPPLEMENT EVERY DAY THE REST OF YOUR LIFE During your first week back home, gradually add back a fiber supplement every day Experiment which form you can tolerate.   There are many forms such as powders, tablets, wafers, gummies, etc Psyllium bran (Metamucil), methylcellulose (Citrucel), Miralax or Glycolax, Benefiber, Flax Seed.  Adjust the dose week-by-week (1/2 dose/day to 6 doses a day) until you are moving your bowels 1-2 times a day.  Cut back the dose or try a different fiber product if it is giving you problems such as diarrhea or bloating. Sometimes a laxative is needed to help jump-start bowels if constipated until the fiber supplement can help regulate your bowels.  If you are tolerating eating & you are farting, it  is okay to try a gentle laxative such as double dose MiraLax, prune juice, or Milk of Magnesia.  Avoid using laxatives too often. Stool softeners can sometimes help counteract the constipating effects of narcotic pain medicines.  It can also cause diarrhea, so avoid using for too long. If you are still constipated despite taking fiber daily, eating solids, and a few doses of laxatives, call our office. Controlling diarrhea Try drinking liquids and eating bland foods for a few days to avoid stressing your intestines further. Avoid dairy products (especially milk & ice cream) for a short time.  The intestines often can lose the ability to digest lactose when stressed. Avoid foods that cause gassiness or bloating.  Typical foods include beans and other legumes, cabbage, broccoli, and dairy foods.  Avoid greasy, spicy, fast foods.  Every person has some sensitivity to other foods, so listen to your body and avoid those foods that trigger problems for you. Probiotics (such as active yogurt, Align, etc) may help repopulate the intestines and colon with normal bacteria and calm down a sensitive digestive tract Adding a fiber supplement gradually can help thicken stools by absorbing excess fluid and retrain the intestines to act more normally.  Slowly increase the dose over a few weeks.  Too much fiber too soon can backfire and cause cramping & bloating. It is okay to try and slow down diarrhea with a few doses of antidiarrheal medicines.   Bismuth subsalicylate (ex. Kayopectate, Pepto Bismol) for a few doses can help control diarrhea.  Avoid if pregnant.   Loperamide (Imodium) can slow down diarrhea.  Start with one tablet (2mg) first.  Avoid if you are having fevers or severe pain.  ILEOSTOMY PATIENTS WILL HAVE CHRONIC DIARRHEA since their colon is not in use.    Drink plenty of liquids.  You will need to drink even more glasses of water/liquid a day to avoid getting dehydrated. Record output from your  ileostomy.  Expect to empty the bag every 3-4 hours at first.  Most people with a permanent ileostomy empty their bag 4-6 times at the least.   Use antidiarrheal medicine (especially Imodium) several times a day to avoid getting dehydrated.  Start with a dose at bedtime & breakfast.    Adjust up or down as needed.  Increase antidiarrheal medications as directed to avoid emptying the bag more than 8 times a day (every 3 hours). Work with your wound ostomy nurse to learn care for your ostomy.  See ostomy care instructions. TROUBLESHOOTING IRREGULAR BOWELS 1) Start with a soft & bland diet. No spicy, greasy, or fried foods.  2) Avoid gluten/wheat or dairy products from diet to see if symptoms improve. 3) Miralax 17gm or flax seed mixed in 8oz. water or juice-daily. May use 2-4 times a day as needed. 4) Gas-X, Phazyme, etc. as needed for gas & bloating.  5) Prilosec (omeprazole) over-the-counter as needed 6)  Consider probiotics (Align, Activa, etc) to help calm the bowels down  Call your doctor if you are getting worse or not getting better.  Sometimes further testing (cultures, endoscopy, X-ray studies, CT scans, bloodwork, etc.) may be needed to help diagnose and treat the cause of the diarrhea. [facility] [address], [address], [place], [place]  [address] [phone] - Main.    [phone]  - Toll Free.   [phone] - Fax [email or web address]  

## 2022-05-13 NOTE — Anesthesia Preprocedure Evaluation (Addendum)
Anesthesia Evaluation  Patient identified by MRN, date of birth, ID band Patient awake    Reviewed: Allergy & Precautions, NPO status , Patient's Chart, lab work & pertinent test results  History of Anesthesia Complications Negative for: history of anesthetic complications  Airway Mallampati: III  TM Distance: >3 FB Neck ROM: Full    Dental no notable dental hx. (+) Dental Advisory Given   Pulmonary sleep apnea , former smoker   Pulmonary exam normal        Cardiovascular negative cardio ROS Normal cardiovascular exam     Neuro/Psych  PSYCHIATRIC DISORDERS Anxiety Depression    negative neurological ROS     GI/Hepatic Neg liver ROS,,,  Endo/Other  diabetes    Renal/GU negative Renal ROS     Musculoskeletal negative musculoskeletal ROS (+)    Abdominal   Peds  Hematology negative hematology ROS (+)   Anesthesia Other Findings   Reproductive/Obstetrics                             Anesthesia Physical Anesthesia Plan  ASA: 2  Anesthesia Plan: General   Post-op Pain Management: Tylenol PO (pre-op)*, Toradol IV (intra-op)* and Minimal or no pain anticipated   Induction: Intravenous, Rapid sequence and Cricoid pressure planned  PONV Risk Score and Plan: 4 or greater and Ondansetron, Dexamethasone, Midazolam and Scopolamine patch - Pre-op  Airway Management Planned: Oral ETT  Additional Equipment:   Intra-op Plan:   Post-operative Plan: Extubation in OR  Informed Consent: I have reviewed the patients History and Physical, chart, labs and discussed the procedure including the risks, benefits and alternatives for the proposed anesthesia with the patient or authorized representative who has indicated his/her understanding and acceptance.     Dental advisory given  Plan Discussed with: Anesthesiologist, CRNA and Surgeon  Anesthesia Plan Comments:        Anesthesia Quick  Evaluation

## 2022-05-13 NOTE — ED Notes (Signed)
Pt took his own blood sugar reads 194

## 2022-05-13 NOTE — Transfer of Care (Signed)
Immediate Anesthesia Transfer of Care Note  Patient: Steven Mays  Procedure(s) Performed: APPENDECTOMY LAPAROSCOPIC  Patient Location: PACU  Anesthesia Type:General  Level of Consciousness: awake, alert , oriented, and patient cooperative  Airway & Oxygen Therapy: Patient Spontanous Breathing and Patient connected to face mask oxygen  Post-op Assessment: Report given to RN and Post -op Vital signs reviewed and stable  Post vital signs: Reviewed and stable  Last Vitals:  Vitals Value Taken Time  BP    Temp    Pulse    Resp    SpO2      Last Pain:  Vitals:   05/13/22 1740  TempSrc: Oral  PainSc:          Complications: No notable events documented.

## 2022-05-13 NOTE — ED Notes (Signed)
Patient transported to [facility]

## 2022-05-13 NOTE — Anesthesia Procedure Notes (Signed)
Procedure Name: Intubation Date/Time: 05/13/2022 6:03 PM  Performed by: Gerald Leitz, CRNAPre-anesthesia Checklist: Patient identified, Patient being monitored, Timeout performed, Emergency Drugs available and Suction available Patient Re-evaluated:Patient Re-evaluated prior to induction Oxygen Delivery Method: Circle system utilized Preoxygenation: Pre-oxygenation with 100% oxygen Induction Type: IV induction Ventilation: Mask ventilation without difficulty Laryngoscope Size: Mac and 3 Grade View: Grade I Tube type: Oral Tube size: 7.5 mm Number of attempts: 1 Airway Equipment and Method: Stylet Placement Confirmation: ETT inserted through vocal cords under direct vision, positive ETCO2 and breath sounds checked- equal and bilateral Secured at: 23 cm Tube secured with: Tape Dental Injury: Teeth and Oropharynx as per pre-operative assessment

## 2022-05-13 NOTE — ED Triage Notes (Signed)
Pt is here for evaluation of right lower quadrant pain with nausea. Reports diagnosed with appendicitis and told to come to the ER.

## 2022-05-13 NOTE — ED Provider Notes (Signed)
[place] EMERGENCY DEPARTMENT AT [facility] Provider Note   CSN: [id number] Arrival date & time: 05/13/22  1545     History  No chief complaint on file.   Steven Mays is a 62 y.o. male.  The history is provided by the patient and medical records. No language interpreter was used.  Abdominal Pain Pain location:  RLQ Pain quality: aching   Pain radiates to:  Does not radiate Pain severity:  Severe Onset quality:  Gradual Duration:  2 days Timing:  Constant Progression:  Worsening Chronicity:  New Relieved by:  Nothing Worsened by:  Palpation Ineffective treatments:  None tried Associated symptoms: chills, diarrhea, fatigue, fever and nausea   Associated symptoms: no anorexia, no chest pain, no constipation, no cough, no dysuria, no shortness of breath and no vomiting        Home Medications Prior to Admission medications   Medication Sig Start Date End Date Taking? Authorizing Provider  ALPRAZolam Duanne Moron) 0.5 MG tablet Take 0.5 mg by mouth as needed.  12/24/11   [provider]  aspirin 81 MG tablet Take 81 mg by mouth daily.    [provider]  buPROPion (WELLBUTRIN XL) 300 MG 24 hr tablet Take 300 mg by mouth daily.    [provider]  Continuous Blood Gluc Sensor (FREESTYLE LIBRE 2 SENSOR) MISC 1 Device by Does not apply route every 14 (fourteen) days. 11/13/21   Shamleffer, Melanie Crazier, MD  empagliflozin (JARDIANCE) 25 MG TABS tablet Take 1 tablet (25 mg total) by mouth daily. 11/13/21   Shamleffer, Melanie Crazier, MD  glucose blood (ACCU-CHEK GUIDE) test strip 1 each by Other route daily. And lancets 1/day 05/20/20   Renato Shin, MD  Injection Device for Insulin (CEQUR SIMPLICITY INSERTER) MISC 1 Device by Does not apply route as directed. 04/07/22   Shamleffer, Melanie Crazier, MD  insulin aspart (NOVOLOG FLEXPEN) 100 UNIT/ML FlexPen Max daily 45 units 11/13/21   Shamleffer, Melanie Crazier, MD  insulin glargine-yfgn  (SEMGLEE, YFGN,) 100 UNIT/ML injection Inject 0.46 mLs (46 Units total) into the skin daily. 04/07/22   Shamleffer, Melanie Crazier, MD  Insulin Pen Needle 32G X 4 MM MISC 1 Device by Does not apply route in the morning, at noon, in the evening, and at bedtime. 11/13/21   Shamleffer, Melanie Crazier, MD  propranolol (INNOPRAN XL) 80 MG 24 hr capsule Take 80 mg by mouth daily.    [provider]  rosuvastatin (CRESTOR) 10 MG tablet Take 10 mg by mouth daily. 10/09/21   [provider]  testosterone enanthate (DELATESTRYL) 200 MG/ML injection Inject into the muscle every 14 (fourteen) days. For IM use only    [provider]  tirzepatide Darcel Bayley) 5 MG/0.5ML Pen Inject 5 mg into the skin once a week. 04/07/22   Shamleffer, Melanie Crazier, MD  traZODone (DESYREL) 100 MG tablet Take 100 mg by mouth at bedtime.    [provider]      Allergies    Amoxicillin-pot clavulanate, Clindamycin, Effexor xr [venlafaxine hcl er], Metformin and related, Nefazodone, and Tetracyclines & related    Review of Systems   Review of Systems  Constitutional:  Positive for chills, fatigue and fever.  HENT:  Negative for congestion.   Respiratory:  Negative for cough, chest tightness, shortness of breath and wheezing.   Cardiovascular:  Negative for chest pain.  Gastrointestinal:  Positive for abdominal pain, diarrhea and nausea. Negative for anorexia, constipation and vomiting.  Genitourinary:  Negative for dysuria.  Musculoskeletal:  Negative for back pain, neck pain and neck stiffness.  Skin:  Negative for rash and wound.  Neurological:  Negative for light-headedness and headaches.  Psychiatric/Behavioral:  Negative for agitation and confusion.   All other systems reviewed and are negative.   Physical Exam Updated Vital Signs BP (!) 169/110 (BP Location: Left Arm)   Pulse (!) 114   Temp 98.3 F (36.8 C) (Oral)   Resp 18   SpO2 98%  Physical Exam Vitals and nursing note  reviewed.  Constitutional:      General: He is not in acute distress.    Appearance: He is well-developed. He is not ill-appearing, toxic-appearing or diaphoretic.  HENT:     Head: Normocephalic and atraumatic.     Nose: No congestion or rhinorrhea.     Mouth/Throat:     Mouth: Mucous membranes are moist.  Eyes:     Conjunctiva/sclera: Conjunctivae normal.  Cardiovascular:     Rate and Rhythm: Normal rate and regular rhythm.     Heart sounds: No murmur heard. Pulmonary:     Effort: Pulmonary effort is normal. No respiratory distress.     Breath sounds: Normal breath sounds. No wheezing, rhonchi or rales.  Chest:     Chest wall: No tenderness.  Abdominal:     General: Abdomen is flat.     Palpations: Abdomen is soft.     Tenderness: There is abdominal tenderness.  Musculoskeletal:        General: No swelling or tenderness.     Cervical back: Neck supple.     Right lower leg: No edema.     Left lower leg: No edema.  Skin:    General: Skin is warm and dry.     Capillary Refill: Capillary refill takes less than 2 seconds.     Findings: No erythema.  Neurological:     General: No focal deficit present.     Mental Status: He is alert.  Psychiatric:        Mood and Affect: Mood normal.     ED Results / Procedures / Treatments   Labs (all labs ordered are listed, but only abnormal results are displayed) Labs Reviewed - No data to display  EKG None  Radiology No results found.        Procedures Procedures    Medications Ordered in ED Medications  Chlorhexidine Gluconate Cloth 2 % PADS 6 each (has no administration in time range)    And  Chlorhexidine Gluconate Cloth 2 % PADS 6 each (has no administration in time range)  acetaminophen (TYLENOL) tablet 1,000 mg (has no administration in time range)  gabapentin (NEURONTIN) capsule 300 mg (has no administration in time range)  ciprofloxacin (CIPRO) IVPB 400 mg (has no administration in time range)   metroNIDAZOLE (FLAGYL) IVPB 500 mg (has no administration in time range)  bupivacaine liposome (EXPAREL) 1.3 % injection 266 mg (has no administration in time range)  celecoxib (CELEBREX) capsule 200 mg (has no administration in time range)  ALPRAZolam (XANAX) tablet 0.5 mg ( Oral MAR Hold 05/13/22 1712)  buPROPion (WELLBUTRIN XL) 24 hr tablet 300 mg ( Oral Automatically Held 05/21/22 1000)  aspirin tablet 81 mg ( Oral Automatically Held 05/21/22 1000)  rosuvastatin (CRESTOR) tablet 10 mg ( Oral Automatically Held 05/21/22 1000)  insulin aspart (novoLOG) injection 0-15 Units ( Subcutaneous Automatically Held 05/21/22 1700)  insulin aspart (novoLOG) injection 0-5 Units ( Subcutaneous Automatically Held 05/21/22 2200)  scopolamine (TRANSDERM-SCOP) 1 MG/3DAYS 1.5 mg (has  no administration in time range)  acetaminophen (TYLENOL) tablet 1,000 mg (has no administration in time range)  scopolamine (TRANSDERM-SCOP) 1 MG/3DAYS (has no administration in time range)  gabapentin (NEURONTIN) 300 MG capsule (has no administration in time range)  celecoxib (CELEBREX) 200 MG capsule (has no administration in time range)  ciprofloxacin (CIPRO) 400 MG/200ML IVPB (has no administration in time range)  acetaminophen (TYLENOL) 500 MG tablet (has no administration in time range)  metroNIDAZOLE (FLAGYL) 500 MG/100ML IVPB (has no administration in time range)  HYDROmorphone (DILAUDID) injection 1 mg (1 mg Intravenous Given 05/13/22 1654)  prochlorperazine (COMPAZINE) injection 10 mg (10 mg Intravenous Given 05/13/22 1654)    ED Course/ Medical Decision Making/ A&P                             Medical Decision Making   JOHNRYAN URBANOVSKY is a 62 y.o. male with a past medical history significant for anxiety, depression, diabetes, and sleep apnea who presents as a transfer from outside hospital for acute appendicitis.  According to patient, for the last day or 2 he has had pain started in the epigastric area and then migrated to  his right lower quadrant.  He has had fevers, chills, nausea, and decreased appetite.  He has had some diarrhea starting today.  He reports the pain is worsened and is now severe.  He went to an outside facility where he had a CT scan and labs showing evidence of acute appendicitis without perforation or abscess.  His labs showed leukocytosis.  He is n.p.o. all day and was sent to see Dr. Zenia Resides whom the outside team spoke with for transfer.  Otherwise he denies acute trauma.  Denies any urinary changes.  No chest pain, shortness of breath, or cough.  On exam, abdomen is tender.  Bowel sounds are appreciated.  Lungs clear.  No murmur.  Good pulses in extremities.  Patient resting but is in pain.  Patient be given pain medicine and nausea medicine and general surgery will be called.  I was able to view the images in the system and he brought a disc as well.  General surgery will see patient and will admit for further management.  Anticipate going to OR this evening.          Final Clinical Impression(s) / ED Diagnoses Final diagnoses:  Acute appendicitis, unspecified acute appendicitis type    Clinical Impression: 1. Acute appendicitis, unspecified acute appendicitis type     Disposition: Admit  This note was prepared with assistance of Dragon voice recognition software. Occasional wrong-word or sound-a-like substitutions may have occurred due to the inherent limitations of voice recognition software.     Cherilynn Schomburg, Gwenyth Allegra, MD 05/13/22 [phone]

## 2022-05-13 NOTE — H&P (Signed)
Steven Mays [birth year]  [id number].    Requesting MD: Sherry Ruffing, MD Chief Complaint/Reason for Consult: appendicitis   HPI:  Steven Mays is a 62 y/o M with a PMH diabetes mellitus, HTN, and depression who presents to [facility] from a [facility] [place] [facility] with a WBC 21 and a CT scan that shows appendicitis.  Patient reports developing RUQ abdominal pain about 25 hours ago. Pain moved to the RLQ. It was initially sharp, but now more dull and achy. Associated with nausea, no emesis. He has had chills, no fevers. Also reports diarrhea. He was given ceftriaxone and flagyl at outside hospital earlier today. He decided to drive to [facility] for surgery.  He denies the use of blood thinners He denies a previous history of abdominal surgery Smokes 4-5 cigarettes daily Drinks alcohol occasionally. Denies any other drug use.  Employer:  [employer], hospice nurse   ROS: As Above Review of Systems  All other systems reviewed and are negative.   Family History  Problem Relation Age of Onset   Hypertension Other    Cancer Other    Colon cancer Neg Hx    Stomach cancer Neg Hx    Diabetes Neg Hx    Rectal cancer Neg Hx     Past Medical History:  Diagnosis Date   Anxiety    Depression    Diabetes mellitus without complication ([place])    Essential tremor    Sleep apnea    cannot tolerate cpap    Past Surgical History:  Procedure Laterality Date   CATARACT EXTRACTION W/ INTRAOCULAR LENS IMPLANT Left    CERVICAL LAMINECTOMY  2019   lumbar lamincectomy  03/16/2006   TONSILLECTOMY  03/17/1979    Social History:  reports that he quit smoking about 15 years ago. His smoking use included cigarettes. He has never used smokeless tobacco. He reports current alcohol use of about 2.0 standard drinks of alcohol per week. He reports that he does not use drugs.  Allergies:  Allergies  Allergen Reactions   Amoxicillin-Pot Clavulanate    Clindamycin    Effexor Xr  [Venlafaxine Hcl Er] Other (See Comments)   Metformin And Related Diarrhea   Nefazodone Other (See Comments)    hallucinations   Tetracyclines & Related Nausea And Vomiting    (Not in a hospital admission)    Physical Exam: Blood pressure (!) 169/110, pulse (!) 114, temperature 98.3 F (36.8 C), temperature source Oral, resp. rate 18, height '5\' 10"'$  (1.778 m), weight 97.5 kg, SpO2 98 %. General: Pleasant male  laying on hospital bed, appears stated age, NAD. HEENT: head -normocephalic, atraumatic; Eyes: Pupils equal and round, no conjunctival injection, anicteric sclerae Neck- Trachea is midline CV- sinus tachycardia, normal S1/S2, no M/R/G, no lower extremity edema  Pulm- breathing is non-labored ORA CTABL, no wheezes, rhales, rhonchi. Abd- soft, mild distention, tender to palpation of the right lower quadrant with voluntary guarding, no peritonitis, no hernias, masses, or organomegaly  GU- deferred  MSK- UE/LE symmetrical, no cyanosis, clubbing, or edema. Neuro- non-focal exam, gait not assessed  Psych- Alert and Oriented x3 with appropriate affect Skin: warm and dry, no rashes or lesions   No results found for this or any previous visit (from the past 48 hour(s)). No results found.   Assessment/Plan Acute appendicitis, without evidence of perforation - afebrile, HR 114 bpm, no hypotension - recommend NPO, IV antibiotics, and proceeding to the operating room for laparoscopic appendectomy. The operative and non-operative management  of appendicitis was discussed with the patient and he would like to proceed with surgery.   FEN - NPO, IVF  VTE - SCD's  ID - Zosyn  Admit - to CCS service for observation   DM2 - SSI, likely resume home meds post-op  Depression/anxiety - resume home wellbutrin, PRN xanax, and trazodone post-op  HTN - resume propanolol post-op Tobacco abuse  I reviewed nursing notes, ED provider notes, last 24 h vitals and pain scores, last 48 h intake and  output, last 24 h labs and trends, and last 24 h imaging results.  Margie Billet, [facility] Surgery 05/13/2022, 4:13 PM Please see Amion for pager number during day hours 7:00am-4:30pm or 7:00am -11:30am on weekends

## 2022-05-13 NOTE — Op Note (Addendum)
PATIENT:  Steven Mays  62 y.o. male  Patient Care Team: Gaynelle Arabian, MD as PCP - General (Family Medicine) Ccs, Md, MD (General Surgery) Shamleffer, Melanie Crazier, MD as Attending Physician (Endocrinology) Doran Stabler, MD as Consulting Physician (Gastroenterology)  PRE-OPERATIVE DIAGNOSIS:  ACUTE APPENDICITIS  POST-OPERATIVE DIAGNOSIS:  ACUTE GANGRENOUS APPENDICITIS  PROCEDURE:  LAPAROSCOPIC APPENDECTOMY   SURGEON:  Adin Hector, MD  ASSISTANT:  (n/a)   ANESTHESIA:  General endotracheal intubation anesthesia (GETA) and Regional TRANSVERSUS ABDOMINIS PLANE (TAP) nerve block -BILATERAL for perioperative & postoperative pain control at the level of the transverse abdominis & preperitoneal spaces along the flank at the anterior axillary line, from subcostal ridge to iliac crest under laparoscopic guidance provided with liposomal bupivacaine (Experel) 31m mixed with 30 mL of bupivicaine 0.25% with epinephrine  Estimated Blood Loss (EBL):   No intake/output data recorded..   (See anesthesia record)  Delay start of Pharmacological VTE agent (>24hrs) due to concerns of significant anemia, surgical blood loss, or risk of bleeding?:  no  DRAINS: (None)  SPECIMEN:  Appendix  DISPOSITION OF SPECIMEN:  Pathology  COUNTS:  Sponge, needle, & instrument counts CORRECT  PLAN OF CARE: Discharge to home after PACU  PATIENT DISPOSITION:  PACU - hemodynamically stable.   INDICATIONS: Patient with concerning symptoms & work up suspicious for appendicitis.  Surgery was recommended:  The anatomy & physiology of the digestive tract was discussed.  The pathophysiology of appendicitis was discussed.  Natural history risks without surgery was discussed.   I feel the risks of no intervention will lead to serious problems that outweigh the operative risks; therefore, I recommended diagnostic laparoscopy with removal of appendix to remove the pathology.  Laparoscopic & open techniques  were discussed.   I noted a good likelihood this will help address the problem.    Risks such as bleeding, infection, abscess, leak, reoperation, possible ostomy, hernia, heart attack, death, and other risks were discussed.  Goals of post-operative recovery were discussed as well.  We will work to minimize complications.  Questions were answered.  The patient expresses understanding & wishes to proceed with surgery.  OR FINDINGS: Retrograde cecal appendix quite large and torturous.  Ischemic with central pockets of gangrene and phlegmon.  CASE DATA:  Type of patient?: LDOW CASE (Surgical Hospitalist [facility] Inpatient)  Status of Case? EMERGENT Add On  Infection Present At Time Of Surgery (PATOS)?  PHLEGMON  DESCRIPTION:   The patient was identified & brought into the operating room. The patient was positioned supine with arms tucked. SCDs were active during the entire case. The patient underwent general anesthesia without any difficulty.  The abdomen was prepped and draped in a sterile fashion. A Surgical Timeout confirmed our plan.   I made a transverse incision through the superior umbilical fold.  I made a small transverse nick through the infraumbilical fascia and confirmed peritoneal entry.  I placed a 541mport.  We induced carbon dioxide insufflation.  Camera inspection revealed no injury.  I placed additional ports under direct laparoscopic visualization.  Upon entering the abdomen (organ space), I encountered a phlegmon involving the appendix .  I mobilized the terminal ileum to proximal ascending colon in a lateral to medial fashion.  I took care to avoid injuring any retroperitoneal structures.  I freed the appendix off its attachments to the ascending colon and cecal mesentery.  It had areas of gangrene within the phlegmon but no abscess.  Ischemic   I elevated the appendix.  I skeletonized the mesoappendix. I was able to free off the base of the appendix which was still viable.  I stapled  the appendix off the cecum using a laparoscopic stapler. I took a healthy cuff of viable cecum. I ligated the mesoappendix and assured hemostasis in the mesentery.  I placed the appendix inside an EcoSac bag and removed out the 56m stapler port.  I did copious irrigation. Hemostasis was good in the mesoappendix, colon mesentery, and retroperitoneum. Staple line was intact on the cecum with no bleeding. I washed out the pelvis, retrohepatic space and right paracolic gutter. I washed out the left side as well.  Hemostasis is good. There was no perforation or injury.  Because the area cleaned up well after irrigation, I did not place a drain.  I closed the 12 mm stapler port site fascia with #1 PDS suture using a suture passer under direct laparoscopic visualization.   I aspirated the carbon dioxide. Ports removed.  I closed skin using 4-0 monocryl stitch.  Sterile dressings applied.  Patient was extubated and sent to the recovery room.   Will observe closely.  Patient wished to go home tonight.  His significant other is available as well.  We will see if that is possible.  Given the phlegmon and gangrene in the setting of insulin-dependent diabetes, I sent a prescription for ciprofloxacin and metronidazole x 5 days given his penicillin allergy.  I discussed operative findings, updated the patient's status, discussed probable steps to recovery, and gave postoperative recommendations to the patient's spouse, RJeni Salles  Recommendations were made.  Questions were answered.  He expressed understanding & appreciation.    SAdin Hector M.D., F.A.C.S. Gastrointestinal and Minimally Invasive Surgery [facility] [address] [place] [phone] / Paging  05/13/2022 7:08 PM

## 2022-05-13 NOTE — ED Notes (Signed)
RN spoke with short stay who stated they will be down shortly to get patient

## 2022-05-14 ENCOUNTER — Encounter (HOSPITAL_COMMUNITY): Payer: Self-pay | Admitting: Surgery

## 2022-05-14 NOTE — Anesthesia Postprocedure Evaluation (Signed)
Anesthesia Post Note  Patient: Steven Mays  Procedure(s) Performed: APPENDECTOMY LAPAROSCOPIC     Anesthesia Type: General Anesthetic complications: no   No notable events documented.  Last Vitals:  Vitals:   05/13/22 2019 05/13/22 2020  BP: (!) 153/89   Pulse: 95 94  Resp: 18   Temp:  36.7 C  SpO2: 91% 93%    Last Pain:  Vitals:   05/13/22 2020  TempSrc:   PainSc: [place]

## 2022-05-15 LAB — SURGICAL PATHOLOGY

## 2022-05-18 ENCOUNTER — Telehealth: Payer: Self-pay

## 2022-05-18 NOTE — Telephone Encounter (Signed)
Patient advised and verbalized understanding 

## 2022-05-18 NOTE — Telephone Encounter (Signed)
Patient has some Trulicity 4.'5mg'$  and wants to know if he can take it. Patient states that he had no side effects with the Trulicity.

## 2022-05-18 NOTE — Telephone Encounter (Signed)
Patient states that he has took 5 doses of the [facility]. He experienced diarrhea, belching, and  extreme abdominal pain. Patient was diagnosis with appendicitis and had surgery last week. Patient will not be taking anymore of the [facility]. Would like to know what you would advise.

## 2022-05-20 ENCOUNTER — Other Ambulatory Visit (HOSPITAL_COMMUNITY): Payer: Self-pay

## 2022-05-20 ENCOUNTER — Telehealth: Payer: Self-pay | Admitting: Pharmacy Technician

## 2022-05-20 NOTE — Telephone Encounter (Signed)
Pharmacy Patient Advocate Encounter   Received notification from CoverMyMeds that prior authorization for CeQur Simplicity 2u is required/requested.   PA submitted on 05/20/22 to (ins) [place] via CoverMyMeds via fax. Key or (Medicaid) confirmation # [id number] Status is pending

## 2022-06-01 ENCOUNTER — Other Ambulatory Visit (HOSPITAL_COMMUNITY): Payer: Self-pay

## 2022-06-01 NOTE — Telephone Encounter (Signed)
Prior authorization request was cancelled and says that PA is not required, however, the device isn't covered by the pt's pharmacy benefit, but they can call the # on the back of their card to find out how to obtain it through their medical benefit.

## 2022-06-02 NOTE — Telephone Encounter (Signed)
Patient advised and will contact his insurance and give [place] a callback if anything else is needed from [place].

## 2022-06-16 ENCOUNTER — Other Ambulatory Visit (HOSPITAL_COMMUNITY): Payer: Self-pay

## 2022-07-14 ENCOUNTER — Telehealth: Payer: Self-pay | Admitting: Nutrition

## 2022-07-14 NOTE — Telephone Encounter (Signed)
Called patient to schedule seQur training.  He will need insulin in a vial and is requesting that he also be called in the [place] 3 sensors to his [employer] on [address].  He will call me on Monday to schedule triaining.

## 2022-07-15 MED ORDER — FREESTYLE LIBRE 3 SENSOR MISC: 3 refills | Status: DC

## 2022-07-15 MED ORDER — INSULIN ASPART 100 UNIT/ML IJ SOLN: INTRAMUSCULAR | 3 refills | Status: DC

## 2022-07-28 ENCOUNTER — Encounter: Payer: BC Managed Care – PPO | Attending: Internal Medicine | Admitting: Nutrition

## 2022-07-28 DIAGNOSIS — E119 Type 2 diabetes mellitus without complications: Secondary | ICD-10-CM | POA: Insufficient documentation

## 2022-07-28 DIAGNOSIS — Z794 Long term (current) use of insulin: Secondary | ICD-10-CM | POA: Insufficient documentation

## 2022-07-29 NOTE — Patient Instructions (Signed)
Change device every 3 days Call help line if questions or problems. Fill device with 150u of Novolog insulin to allow for high blood sugar readings

## 2022-07-29 NOTE — Progress Notes (Signed)
Patient was trained on the use of the CeQur Simplicity device.  He was trained on how to fill, apply and use the divide.  He filled the device with 110u of Novolog insulin and applied this to his left upper abdomen.  He was encourage to read all information given and to watch video on their website.  He had no final questions.

## 2022-07-30 ENCOUNTER — Other Ambulatory Visit: Payer: Self-pay | Admitting: Internal Medicine

## 2022-09-07 NOTE — Progress Notes (Unsigned)
Name: Steven Mays  Age/ Sex: 62 y.o., male   MRN/ DOB: [id number], 12-Sep-1960     PCP: Blair Heys, MD   Reason for Endocrinology Evaluation: Type 2 Diabetes Mellitus  Initial Endocrine Consultative Visit: 03/22/2018    PATIENT IDENTIFIER: [name] is a 62 y.o. male with a past medical history of T2DM, anxiety and depression, OSA. The patient has followed with Endocrinology clinic since 03/22/2018 for consultative assistance with management of his diabetes.  DIABETIC HISTORY:  Steven Mays was diagnosed with DM 2016, intolerant to metformin due to diarrhea , and started insulin therapy in 2023. Marland Kitchen His hemoglobin A1c has ranged from 8.4% in 12/2020, peaking at 11.5% in 04/2021  He was followed by Dr. Everardo All from January 2020 until May 2023   On his initial visit with me 10/2021, his A1c was 11.1% he was on acrobatics, bromocriptine, glimepiride, colesevelam, Semglee Jardiance and Trulicity I had stopped a carbose, bromocriptine, glimepiride, and colesevelam, increase basal insulin started him on prandial insulin, continue Jardiance and Trulicity   He was trained on CeQur  simplicity device 07/2022  He took [facility] for approximately a month, developed appendicitis and opted not to continue on Mounjaro as well as sulfur burps  and flatulence   Started Ozempic 08/2022  SUBJECTIVE:   During the last visit (04/07/2022): A1c 7.0%      Today (09/08/2022): Steven Mays is here for follow-up on diabetes management.  He checks his blood sugars multiple  times daily, through CGM. The patient has not had hypoglycemic episodes since the last clinic visit.   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  He is s/p appendectomy 04/2022, recovering well   Has polydipsia  Denies abdominal pain  Denies constipation or diarrhea      HOME DIABETES REGIMEN:  Jardiance 25 mg daily Semglee 46 units daily- takes 44 units NovoLog 12 -16 units 3 times  daily before every meal Correction factor: NovoLog (BG -130/25)     Statin: Yes ACE-I/ARB: Yes     CONTINUOUS GLUCOSE MONITORING RECORD INTERPRETATION    Dates of Recording: 6/12-6/25/2024  Sensor description:freestyle libre  Results statistics:   CGM use % of time 7  Average and SD 211/36.5  Time in range 43%  % Time Above 180 13  % Time above 250 44  % Time Below target 0   Glycemic patterns summary: Limited data with only 2 days available over the past 14 days   Hyperglycemic episodes  all day and night   Hypoglycemic episodes occurred  n/a  Overnight periods: trends down      DIABETIC COMPLICATIONS: Microvascular complications:   Denies: CKD Last Eye Exam: Completed 06/2020  Macrovascular complications:   Denies: CAD, CVA, PVD   HISTORY:  Past Medical History:  Past Medical History:  Diagnosis Date   Anxiety    Depression    Diabetes mellitus without complication (HCC)    Essential tremor    Sleep apnea    cannot tolerate cpap   Past Surgical History:  Past Surgical History:  Procedure Laterality Date   CATARACT EXTRACTION W/ INTRAOCULAR LENS IMPLANT Left    CERVICAL LAMINECTOMY  2019   LAPAROSCOPIC APPENDECTOMY N/A 05/13/2022   Procedure: APPENDECTOMY LAPAROSCOPIC;  Surgeon: Karie Soda, MD;  Location: [facility];  Service: General;  Laterality: N/A;   lumbar lamincectomy  03/16/2006   TONSILLECTOMY  03/17/1979   Social History:  reports that he  quit smoking about 15 years ago. His smoking use included cigarettes. He has never used smokeless tobacco. He reports current alcohol use of about 2.0 standard drinks of alcohol per week. He reports that he does not use drugs. Family History:  Family History  Problem Relation Age of Onset   Hypertension Other    Cancer Other    Colon cancer Neg Hx    Stomach cancer Neg Hx    Diabetes Neg Hx    Rectal cancer Neg Hx      HOME MEDICATIONS: Allergies as of 09/08/2022       Reactions   Effexor  Xr [venlafaxine Hcl Er] Other (See Comments)   "Jumped out of my skin"   Metformin And Related Diarrhea   Nefazodone Other (See Comments)   Hallucinations (Tolerates trazodone)   Propranolol Other (See Comments)   "Depression"   Sertraline Other (See Comments)   Sexual side effects   Tetracyclines & Related Nausea And Vomiting   Amoxicillin-pot Clavulanate Rash, Other (See Comments)   Rash one time.  Tolerated other PCN & ceftriaxone since, per patient   Clindamycin Diarrhea, Rash, Other (See Comments)   "Red man" syndrome         Medication List        Accurate as of September 08, 2022  7:33 AM. If you have any questions, ask your nurse or doctor.          STOP taking these medications    ciprofloxacin 500 MG tablet Commonly known as: Cipro Stopped by: Scarlette Shorts, MD   tirzepatide 5 MG/0.5ML Pen Commonly known as: MOUNJARO Stopped by: Scarlette Shorts, MD       TAKE these medications    Accu-Chek Guide test strip Generic drug: glucose blood 1 each by Other route daily. And lancets 1/day   ALPRAZolam 0.5 MG tablet Commonly known as: XANAX Take 0.5 mg by mouth 2 (two) times daily as needed for anxiety.   aspirin 81 MG tablet Take 81 mg by mouth daily.   Benadryl Allergy 25 MG tablet Generic drug: diphenhydrAMINE Take 25 mg by mouth every 6 (six) hours as needed for sleep.   buPROPion 300 MG 24 hr tablet Commonly known as: WELLBUTRIN XL Take 300 mg by mouth daily.   CeQur Simplicity Inserter Misc APPLY PATCH AS DIRECTED AND CHANGE EVERY 3 DAYS. DOSE INSULIN AS DIRECTED (1 SQUEEZE = 2 UNITS OF INSULIN).   empagliflozin 25 MG Tabs tablet Commonly known as: JARDIANCE Take 1 tablet (25 mg total) by mouth daily.   FreeStyle Libre 2 Sensor Misc 1 Device by Does not apply route every 14 (fourteen) days.   FreeStyle Libre 3 Sensor Misc Place 1 sensor on the skin every 14 days. Use to check glucose continuously   HYDROcodone-acetaminophen 5-325  MG tablet Commonly known as: NORCO/VICODIN Take 1-2 tablets by mouth every 6 (six) hours as needed for moderate pain or severe pain.   insulin glargine-yfgn 100 UNIT/ML injection Commonly known as: Semglee (yfgn) Inject 0.46 mLs (46 Units total) into the skin daily. What changed:  how much to take when to take this   Insulin Pen Needle 32G X 4 MM Misc 1 Device by Does not apply route in the morning, at noon, in the evening, and at bedtime.   lisinopril 10 MG tablet Commonly known as: ZESTRIL Take 10 mg by mouth daily.   NovoLOG FlexPen 100 UNIT/ML FlexPen Generic drug: insulin aspart Max daily 45 units What changed:  how much  to take how to take this when to take this additional instructions   insulin aspart 100 UNIT/ML injection Commonly known as: NovoLOG 40 units max daily What changed: Another medication with the same name was changed. Make sure you understand how and when to take each.   ondansetron 4 MG tablet Commonly known as: ZOFRAN Take 4 mg by mouth every 8 (eight) hours as needed for nausea or vomiting.   propranolol 80 MG 24 hr capsule Commonly known as: INNOPRAN XL Take 80 mg by mouth daily.   rosuvastatin 10 MG tablet Commonly known as: CRESTOR Take 10 mg by mouth daily.   Testosterone Cypionate 200 MG/ML Soln Inject 100 mg into the skin every 14 (fourteen) days.   traZODone 100 MG tablet Commonly known as: DESYREL Take 100 mg by mouth at bedtime as needed for sleep.         OBJECTIVE:   Vital Signs: BP 124/80 (BP Location: Left Arm, Patient Position: Sitting, Cuff Size: Large)   Pulse 69   Ht 5\' 10"  (1.778 m)   Wt 218 lb (98.9 kg)   SpO2 93%   BMI 31.28 kg/m   Wt Readings from Last 3 Encounters:  09/08/22 218 lb (98.9 kg)  05/13/22 215 lb (97.5 kg)  04/07/22 222 lb (100.7 kg)     Exam: General: Pt appears well and is in NAD  Lungs: Clear with good BS bilat   Heart: RRR   Extremities: No pretibial edema.   Neuro: MS is good  with appropriate affect, pt is alert and Ox3     DM Foot Exam 04/07/2022   The skin of the feet is intact without sores or ulcerations. The pedal pulses are 2+ on right and 2+ on left. The sensation is intact to a screening 5.07, 10 gram monofilament bilaterally     DATA REVIEWED:  Lab Results  Component Value Date   HGBA1C 8.3 (A) 09/08/2022   HGBA1C 7.0 (A) 04/07/2022   HGBA1C 11.1 (A) 11/13/2021   05/13/2022 BUN 18 CR 0.7 GFR >90   ASSESSMENT / PLAN / RECOMMENDATIONS:   1) Type 2 Diabetes Mellitus, poorly controlled, Without complications - Most recent A1c of 8.3 %. Goal A1c < 7.0 %.     -Patient with worsening glycemic control, this is partly due to lack of GLP-1 agonist intake.  He used to be on Trulicity but he was developing burning sensation with injection, we started [facility] but he developed appendicitis as well as increased flatulence and has opted not to go back on Mounjaro again -I have recommended Ozempic as below -He has been using CeQur  -I will also increase Semglee and NovoLog as below -He was given #1 pen of Ozempic sample as well as #1 sensor freestyle libre  MEDICATIONS:  Continue Jardiance 25 mg daily Increase Semglee to 46 units daily Increase NovoLog 14 units 3 times daily before every meal Continue correction factor : NovoLog (BG -130/25) Started Ozempic 0.5 mg weekly   EDUCATION / INSTRUCTIONS: BG monitoring instructions: Patient is instructed to check his blood sugars 3 times a day, before meals. Call [facility] if: BG persistently < 70  I reviewed the Rule of 15 for the treatment of hypoglycemia in detail with the patient. Literature supplied.   2) Diabetic complications:  Eye: Does not at have known diabetic retinopathy.  Neuro/ Feet: Does not have known diabetic peripheral neuropathy .  Renal: Patient does not have known baseline CKD. He   is not on  an ACEI/ARB at present.     F/U in 4 months   Signed  electronically by: Lyndle Herrlich, MD  [facility]  [facility] [address] [place], [place] [address] Phone: [phone] FAX: [phone]   CC: Blair Heys, MD [address] [place] [address] Phone: [phone]  Fax: [phone]  Return to Endocrinology clinic as below: No future appointments.

## 2022-09-08 ENCOUNTER — Encounter: Payer: Self-pay | Admitting: Internal Medicine

## 2022-09-08 ENCOUNTER — Ambulatory Visit: Payer: BC Managed Care – PPO | Admitting: Internal Medicine

## 2022-09-08 VITALS — BP 124/80 | HR 69 | Ht 70.0 in | Wt 218.0 lb

## 2022-09-08 DIAGNOSIS — Z7984 Long term (current) use of oral hypoglycemic drugs: Secondary | ICD-10-CM

## 2022-09-08 DIAGNOSIS — Z794 Long term (current) use of insulin: Secondary | ICD-10-CM

## 2022-09-08 DIAGNOSIS — E119 Type 2 diabetes mellitus without complications: Secondary | ICD-10-CM

## 2022-09-08 DIAGNOSIS — Z7985 Long-term (current) use of injectable non-insulin antidiabetic drugs: Secondary | ICD-10-CM

## 2022-09-08 DIAGNOSIS — E1165 Type 2 diabetes mellitus with hyperglycemia: Secondary | ICD-10-CM | POA: Diagnosis not present

## 2022-09-08 LAB — POCT GLYCOSYLATED HEMOGLOBIN (HGB A1C): Hemoglobin A1C: 8.3 % — AB (ref 4.0–5.6)

## 2022-09-08 MED ORDER — OZEMPIC (0.25 OR 0.5 MG/DOSE) 2 MG/1.5ML ~~LOC~~ SOPN: 0.5000 mg | PEN_INJECTOR | SUBCUTANEOUS | 3 refills | Status: DC

## 2022-09-08 NOTE — Patient Instructions (Addendum)
Start Ozempic 0.5 mg weekly  Continue Jardiance 25 mg daily  Novolog 14 units with each meal ( as a baseline ) Increase  Semglee to 46 units ONCE daily  Novolog correctional insulin: ADD extra units on insulin to your meal-time Novolog dose if your blood sugars are higher than 155. Use the scale below to help guide you:   Blood sugar before meal Number of units to inject  Less than 155 0 unit  156 -  180 1 units  181 -  205 2 units  206 -  230 3 units  231 -  255 4 units  256 -  280 5 units  281 -  305 6 units  306 -  330 7 units  331 -  355 8 units  356 - 380 9 units  381 - 405 10 units   406 - 430 11 units     HOW TO TREAT LOW BLOOD SUGARS (Blood sugar LESS THAN 70 MG/DL) Please follow the RULE OF 15 for the treatment of hypoglycemia treatment (when your (blood sugars are less than 70 mg/dL)   STEP 1: Take 15 grams of carbohydrates when your blood sugar is low, which includes:  3-4 GLUCOSE TABS  OR 3-4 OZ OF JUICE OR REGULAR SODA OR ONE TUBE OF GLUCOSE GEL    STEP 2: RECHECK blood sugar in 15 MINUTES STEP 3: If your blood sugar is still low at the 15 minute recheck --> then, go back to STEP 1 and treat AGAIN with another 15 grams of carbohydrates.

## 2022-10-22 DIAGNOSIS — Z0000 Encounter for general adult medical examination without abnormal findings: Secondary | ICD-10-CM | POA: Diagnosis not present

## 2022-10-22 DIAGNOSIS — I10 Essential (primary) hypertension: Secondary | ICD-10-CM | POA: Diagnosis not present

## 2022-10-22 DIAGNOSIS — E781 Pure hyperglyceridemia: Secondary | ICD-10-CM | POA: Diagnosis not present

## 2022-10-30 ENCOUNTER — Other Ambulatory Visit: Payer: Self-pay

## 2022-10-30 MED ORDER — EMPAGLIFLOZIN 25 MG PO TABS: 25.0000 mg | ORAL_TABLET | Freq: Every day | ORAL | 3 refills | Status: DC

## 2022-12-09 DIAGNOSIS — Z23 Encounter for immunization: Secondary | ICD-10-CM | POA: Diagnosis not present

## 2022-12-15 ENCOUNTER — Other Ambulatory Visit: Payer: Self-pay

## 2022-12-15 MED ORDER — INSULIN GLARGINE-YFGN 100 UNIT/ML ~~LOC~~ SOLN: 46.0000 [IU] | Freq: Every day | SUBCUTANEOUS | 1 refills | Status: DC

## 2022-12-15 MED ORDER — INSULIN ASPART 100 UNIT/ML IJ SOLN: INTRAMUSCULAR | 1 refills | Status: DC

## 2022-12-22 ENCOUNTER — Ambulatory Visit: Payer: BC Managed Care – PPO | Admitting: Internal Medicine

## 2022-12-22 NOTE — Progress Notes (Deleted)
Name: Steven Mays  Age/ Sex: 62 y.o., male   MRN/ DOB: [id number], [birth year]     PCP: Steven Heys, MD   Reason for Endocrinology Evaluation: Type 2 Diabetes Mellitus  Initial Endocrine Consultative Visit: 03/22/2018    PATIENT IDENTIFIER: Steven Mays is a 62 y.o. male with a past medical history of T2DM, anxiety and depression, OSA. The patient has followed with Endocrinology clinic since 03/22/2018 for consultative assistance with management of his diabetes.  DIABETIC HISTORY:  Steven Mays was diagnosed with DM 2016, intolerant to metformin due to diarrhea , and started insulin therapy in 2023. Marland Kitchen His hemoglobin A1c has ranged from 8.4% in 12/2020, peaking at 11.5% in 04/2021  He was followed by Steven Mays from January 2020 until May 2023   On his initial visit with me 10/2021, his A1c was 11.1% he was on acrobatics, bromocriptine, glimepiride, colesevelam, Semglee Jardiance and Trulicity I had stopped a carbose, bromocriptine, glimepiride, and colesevelam, increase basal insulin started him on prandial insulin, continue Jardiance and Trulicity   He was trained on CeQur  simplicity device 07/2022  He took Steven Mays for approximately a month, developed appendicitis and opted not to continue on Mounjaro as well as sulfur burps  and flatulence   Started Ozempic 08/2022  SUBJECTIVE:   During the last visit (09/08/2022): A1c 8.3%      Today (12/22/2022): Steven Mays is here for follow-up on diabetes management.  He checks his blood sugars multiple  times daily, through CGM. The patient has not had hypoglycemic episodes since the last clinic visit.   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  He is s/p appendectomy 04/2022, recovering well   Has polydipsia  Denies abdominal pain  Denies constipation or diarrhea      HOME DIABETES REGIMEN:  Jardiance 25 mg daily Ozempic 0.5 mg weekly Semglee 46 units daily- takes 44 units NovoLog  14 units 3 times daily before every meal Correction factor: NovoLog (BG -130/25)     Statin: Yes ACE-I/ARB: Yes     CONTINUOUS GLUCOSE MONITORING RECORD INTERPRETATION    Dates of Recording: 6/12-6/25/2024  Sensor description:freestyle libre  Results statistics:   CGM use % of time 7  Average and SD 211/36.5  Time in range 43%  % Time Above 180 13  % Time above 250 44  % Time Below target 0   Glycemic patterns summary: Limited data with only 2 days available over the past 14 days   Hyperglycemic episodes  Mays day and night   Hypoglycemic episodes occurred  n/a  Overnight periods: trends down      DIABETIC COMPLICATIONS: Microvascular complications:   Denies: CKD Last Eye Exam: Completed 06/2020  Macrovascular complications:   Denies: CAD, CVA, PVD   HISTORY:  Past Medical History:  Past Medical History:  Diagnosis Date   Anxiety    Depression    Diabetes mellitus without complication (HCC)    Essential tremor    Sleep apnea    cannot tolerate cpap   Past Surgical History:  Past Surgical History:  Procedure Laterality Date   CATARACT EXTRACTION W/ INTRAOCULAR LENS IMPLANT Left    CERVICAL LAMINECTOMY  2019   LAPAROSCOPIC APPENDECTOMY N/A 05/13/2022   Procedure: APPENDECTOMY LAPAROSCOPIC;  Surgeon: Steven Soda, MD;  Location: [facility];  Service: General;  Laterality: N/A;   lumbar lamincectomy  03/16/2006   TONSILLECTOMY  03/17/1979   Social History:  reports that he quit smoking about 15 years ago. His smoking use included cigarettes. He has never used smokeless tobacco. He reports current alcohol use of about 2.0 standard drinks of alcohol per week. He reports that he does not use drugs. Family History:  Family History  Problem Relation Age of Onset   Hypertension Other    Cancer Other    Colon cancer Neg Hx    Stomach cancer Neg Hx    Diabetes Neg Hx    Rectal cancer Neg Hx      HOME MEDICATIONS: Allergies as of 12/22/2022        Reactions   Effexor Xr [venlafaxine Hcl Er] Other (See Comments)   "Jumped out of my skin"   Metformin And Related Diarrhea   Nefazodone Other (See Comments)   Hallucinations (Tolerates trazodone)   Propranolol Other (See Comments)   "Depression"   Sertraline Other (See Comments)   Sexual side effects   Tetracyclines & Related Nausea And Vomiting   Amoxicillin-pot Clavulanate Rash, Other (See Comments)   Rash one time.  Tolerated other PCN & ceftriaxone since, per patient   Clindamycin Diarrhea, Rash, Other (See Comments)   "Red man" syndrome         Medication List        Accurate as of December 22, 2022  6:50 AM. If you have any questions, ask your nurse or doctor.          Accu-Chek Guide test strip Generic drug: glucose blood 1 each by Other route daily. And lancets 1/day   ALPRAZolam 0.5 MG tablet Commonly known as: XANAX Take 0.5 mg by mouth 2 (two) times daily as needed for anxiety.   aspirin 81 MG tablet Take 81 mg by mouth daily.   Benadryl Allergy 25 MG tablet Generic drug: diphenhydrAMINE Take 25 mg by mouth every 6 (six) hours as needed for sleep.   buPROPion 300 MG 24 hr tablet Commonly known as: WELLBUTRIN XL Take 300 mg by mouth daily.   CeQur Simplicity Inserter Misc APPLY PATCH AS DIRECTED AND CHANGE EVERY 3 DAYS. DOSE INSULIN AS DIRECTED (1 SQUEEZE = 2 UNITS OF INSULIN).   empagliflozin 25 MG Tabs tablet Commonly known as: JARDIANCE Take 1 tablet (25 mg total) by mouth daily.   FreeStyle Libre 2 Sensor Misc 1 Device by Does not apply route every 14 (fourteen) days.   FreeStyle Libre 3 Sensor Misc Place 1 sensor on the skin every 14 days. Use to check glucose continuously   HYDROcodone-acetaminophen 5-325 MG tablet Commonly known as: NORCO/VICODIN Take 1-2 tablets by mouth every 6 (six) hours as needed for moderate pain or severe pain.   insulin glargine-yfgn 100 UNIT/ML injection Commonly known as: Semglee (yfgn) Inject 0.46 mLs (46  Units total) into the skin daily.   Insulin Pen Needle 32G X 4 MM Misc 1 Device by Does not apply route in the morning, at noon, in the evening, and at bedtime.   lisinopril 10 MG tablet Commonly known as: ZESTRIL Take 10 mg by mouth daily.   NovoLOG FlexPen 100 UNIT/ML FlexPen Generic drug: insulin aspart Max daily 45 units What changed:  how much to take how to take this when to take this additional instructions   insulin aspart 100 UNIT/ML injection Commonly known as: NovoLOG NovoLog 14 units 3 times daily before every mealContinue correction factor : NovoLog (BG -130/25) 40 units max daily What changed: Another medication with the same name was changed. Make sure you understand how  and when to take each.   ondansetron 4 MG tablet Commonly known as: ZOFRAN Take 4 mg by mouth every 8 (eight) hours as needed for nausea or vomiting.   Ozempic (0.25 or 0.5 MG/DOSE) 2 MG/1.5ML Sopn Generic drug: Semaglutide(0.25 or 0.5MG /DOS) Inject 0.5 mg into the skin once a week.   propranolol 80 MG 24 hr capsule Commonly known as: INNOPRAN XL Take 80 mg by mouth daily.   rosuvastatin 10 MG tablet Commonly known as: CRESTOR Take 10 mg by mouth daily.   Testosterone Cypionate 200 MG/ML Soln Inject 100 mg into the skin every 14 (fourteen) days.   traZODone 100 MG tablet Commonly known as: DESYREL Take 100 mg by mouth at bedtime as needed for sleep.         OBJECTIVE:   Vital Signs: There were no vitals taken for this visit.  Wt Readings from Last 3 Encounters:  09/08/22 218 lb (98.9 kg)  05/13/22 215 lb (97.5 kg)  04/07/22 222 lb (100.7 kg)     Exam: General: Pt appears well and is in NAD  Lungs: Clear with good BS bilat   Heart: RRR   Extremities: No pretibial edema.   Neuro: MS is good with appropriate affect, pt is alert and Ox3     DM Foot Exam 04/07/2022   The skin of the feet is intact without sores or ulcerations. The pedal pulses are 2+ on right and 2+ on  left. The sensation is intact to a screening 5.07, 10 gram monofilament bilaterally     DATA REVIEWED:  Lab Results  Component Value Date   HGBA1C 8.3 (A) 09/08/2022   HGBA1C 7.0 (A) 04/07/2022   HGBA1C 11.1 (A) 11/13/2021   05/13/2022 BUN 18 CR 0.7 GFR >90   ASSESSMENT / PLAN / RECOMMENDATIONS:   1) Type 2 Diabetes Mellitus, poorly controlled, Without complications - Most recent A1c of 8.3 %. Goal A1c < 7.0 %.     -Patient with worsening glycemic control, this is partly due to lack of GLP-1 agonist intake.  He used to be on Trulicity but he was developing burning sensation with injection, we started [facility] but he developed appendicitis as well as increased flatulence and has opted not to go back on Mounjaro again -I have recommended Ozempic as below -He has been using CeQur  -I will also increase Semglee and NovoLog as below -He was given #1 pen of Ozempic sample as well as #1 sensor freestyle libre  MEDICATIONS:  Continue Jardiance 25 mg daily Increase Semglee to 46 units daily Increase NovoLog 14 units 3 times daily before every meal Continue correction factor : NovoLog (BG -130/25) Started Ozempic 0.5 mg weekly   EDUCATION / INSTRUCTIONS: BG monitoring instructions: Patient is instructed to check his blood sugars 3 times a day, before meals. Call [facility] if: BG persistently < 70  I reviewed the Rule of 15 for the treatment of hypoglycemia in detail with the patient. Literature supplied.   2) Diabetic complications:  Eye: Does not at have known diabetic retinopathy.  Neuro/ Feet: Does not have known diabetic peripheral neuropathy .  Renal: Patient does not have known baseline CKD. He   is not on an ACEI/ARB at present.     F/U in 4 months   Signed electronically by: Lyndle Herrlich, MD  [facility]  [facility] [address] [place], [place] [address] Phone: [phone] FAX:  [phone]   CC: Steven Heys, MD [phone]  [address] [place] [address] Phone: [phone]  Fax: [phone]  Return to Endocrinology clinic as below: Future Appointments  Date Time Provider Department Center  12/22/2022  7:30 AM Anmol Fleck, Konrad Dolores, MD [id number] None

## 2023-02-04 ENCOUNTER — Ambulatory Visit: Payer: BC Managed Care – PPO | Admitting: Internal Medicine

## 2023-04-23 ENCOUNTER — Encounter: Payer: Self-pay | Admitting: Internal Medicine

## 2023-04-23 ENCOUNTER — Ambulatory Visit: Payer: BC Managed Care – PPO | Admitting: Internal Medicine

## 2023-04-23 VITALS — BP 120/80 | HR 77 | Ht 70.0 in | Wt 212.0 lb

## 2023-04-23 DIAGNOSIS — Z794 Long term (current) use of insulin: Secondary | ICD-10-CM

## 2023-04-23 DIAGNOSIS — E1165 Type 2 diabetes mellitus with hyperglycemia: Secondary | ICD-10-CM

## 2023-04-23 LAB — POCT GLYCOSYLATED HEMOGLOBIN (HGB A1C): Hemoglobin A1C: 9.6 % — AB (ref 4.0–5.6)

## 2023-04-23 LAB — POCT GLUCOSE (DEVICE FOR HOME USE): POC Glucose: 225 mg/dL — AB (ref 70–99)

## 2023-04-23 MED ORDER — EMPAGLIFLOZIN 25 MG PO TABS: 25.0000 mg | ORAL_TABLET | Freq: Every day | ORAL | 3 refills | Status: AC

## 2023-04-23 MED ORDER — INSULIN PEN NEEDLE 32G X 4 MM MISC: 1.0000 | Freq: Four times a day (QID) | 3 refills | Status: AC

## 2023-04-23 MED ORDER — INSULIN GLARGINE-YFGN 100 UNIT/ML ~~LOC~~ SOLN: 52.0000 [IU] | Freq: Every day | SUBCUTANEOUS | 4 refills | Status: DC

## 2023-04-23 MED ORDER — NOVOLOG FLEXPEN 100 UNIT/ML ~~LOC~~ SOPN: PEN_INJECTOR | SUBCUTANEOUS | 3 refills | Status: AC

## 2023-04-23 MED ORDER — FREESTYLE LIBRE 3 SENSOR MISC: 3 refills | Status: DC

## 2023-04-23 NOTE — Patient Instructions (Signed)
  Continue Jardiance  25 mg daily  Novolog  14 units with each meal ( as a baseline ) Increase  Semglee  to 52 units ONCE daily  Novolog  correctional insulin : ADD extra units on insulin  to your meal-time Novolog  dose if your blood sugars are higher than 155. Use the scale below to help guide you:   Blood sugar before meal Number of units to inject  Less than 155 0 unit  156 -  180 1 units  181 -  205 2 units  206 -  230 3 units  231 -  255 4 units  256 -  280 5 units  281 -  305 6 units  306 -  330 7 units  331 -  355 8 units  356 - 380 9 units  381 - 405 10 units   406 - 430 11 units     HOW TO TREAT LOW BLOOD SUGARS (Blood sugar LESS THAN 70 MG/DL) Please follow the RULE OF 15 for the treatment of hypoglycemia treatment (when your (blood sugars are less than 70 mg/dL)   STEP 1: Take 15 grams of carbohydrates when your blood sugar is low, which includes:  3-4 GLUCOSE TABS  OR 3-4 OZ OF JUICE OR REGULAR SODA OR ONE TUBE OF GLUCOSE GEL    STEP 2: RECHECK blood sugar in 15 MINUTES STEP 3: If your blood sugar is still low at the 15 minute recheck --> then, go back to STEP 1 and treat AGAIN with another 15 grams of carbohydrates.

## 2023-04-23 NOTE — Progress Notes (Signed)
 Name: Steven Mays  Age/ Sex: 63 y.o., male   MRN/ DOB: [id number], [birth year]     PCP: Hugh Charleston, MD (Inactive)   Reason for Endocrinology Evaluation: Type 2 Diabetes Mellitus  Initial Endocrine Consultative Visit: 03/22/2018    PATIENT IDENTIFIER: Steven Mays is a 63 y.o. male with a past medical history of T2DM, anxiety and depression, OSA. The patient has followed with Endocrinology clinic since 03/22/2018 for consultative assistance with management of his diabetes.  DIABETIC HISTORY:  Steven Mays was diagnosed with DM 2016, intolerant to metformin due to diarrhea , and started insulin  therapy in 2023. SABRA His hemoglobin A1c has ranged from 8.4% in 12/2020, peaking at 11.5% in 04/2021  He was followed by Dr. Kassie from January 2020 until May 2023   On his initial visit with me 10/2021, his A1c was 11.1% he was on acrobatics, bromocriptine , glimepiride , colesevelam , Semglee  Jardiance  and Trulicity  I had stopped a carbose, bromocriptine , glimepiride , and colesevelam , increase basal insulin  started him on prandial insulin , continue Jardiance  and Trulicity    He was trained on CeQur  simplicity device 07/2022  He took Mounjaro  for approximately a month, developed appendicitis and opted not to continue on Mounjaro  as well as sulfur burps  and flatulence   Started Ozempic  08/2022 but this was discontinued 03/2023 due to diarrhea   SUBJECTIVE:   During the last visit (09/08/2022): A1c 8.3%     Today (04/23/2023): Steven Mays is here for follow-up on diabetes management.  He has not been using freestyle libre, used finger stick every other day, no hypoglycemia   No diarrhea since being off Ozempic  On PPI for GERD  CeQur has been cost prohibitive  No nausea or vomiting    HOME DIABETES REGIMEN:  Jardiance  25 mg daily Ozempic  0.5 mg weekly- not taking  Semglee  48 units daily NovoLog  14 units 3 times daily before every meal- 10-14 units  Correction factor: NovoLog  (BG -130/25)   CeQur    Statin: Yes ACE-I/ARB: Yes     CONTINUOUS GLUCOSE MONITORING RECORD INTERPRETATION: not using       DIABETIC COMPLICATIONS: Microvascular complications:   Denies: CKD Last Eye Exam: Completed 06/2020  Macrovascular complications:   Denies: CAD, CVA, PVD   HISTORY:  Past Medical History:  Past Medical History:  Diagnosis Date   Anxiety    Depression    Diabetes mellitus without complication (HCC)    Essential tremor    Sleep apnea    cannot tolerate cpap   Past Surgical History:  Past Surgical History:  Procedure Laterality Date   CATARACT EXTRACTION W/ INTRAOCULAR LENS IMPLANT Left    CERVICAL LAMINECTOMY  2019   LAPAROSCOPIC APPENDECTOMY N/A 05/13/2022   Procedure: APPENDECTOMY LAPAROSCOPIC;  Surgeon: Sheldon Standing, MD;  Location: [facility];  Service: General;  Laterality: N/A;   lumbar lamincectomy  03/16/2006   TONSILLECTOMY  03/17/1979   Social History:  reports that he quit smoking about 16 years ago. His smoking use included cigarettes. He has never used smokeless tobacco. He reports current alcohol use of about 2.0 standard drinks of alcohol per week. He reports that he does not use drugs. Family History:  Family History  Problem Relation Age of Onset   Hypertension Other    Cancer Other    Colon cancer Neg Hx    Stomach cancer Neg Hx    Diabetes Neg Hx    Rectal cancer Neg Hx      HOME MEDICATIONS: Allergies as of 04/23/2023  Reactions   Effexor Xr [venlafaxine Hcl Er] Other (See Comments)   Jumped out of my skin   Metformin And Related Diarrhea   Nefazodone Other (See Comments)   Hallucinations (Tolerates trazodone )   Propranolol Other (See Comments)   Depression   Sertraline Other (See Comments)   Sexual side effects   Tetracyclines & Related Nausea And Vomiting   Amoxicillin-pot Clavulanate Rash, Other (See Comments)   Rash one time.  Tolerated other PCN & ceftriaxone  since, per patient   Clindamycin  Diarrhea, Rash,  Other (See Comments)   Red man syndrome         Medication List        Accurate as of April 23, 2023  7:53 AM. If you have any questions, ask your nurse or doctor.          STOP taking these medications    Ozempic  (0.25 or 0.5 MG/DOSE) 2 MG/1.5ML Sopn Generic drug: Semaglutide (0.25 or 0.5MG /DOS) Stopped by: Arden Tinoco J Kyle Stansell       TAKE these medications    Accu-Chek Guide test strip Generic drug: glucose blood 1 each by Other route daily. And lancets 1/day   ALPRAZolam  0.5 MG tablet Commonly known as: XANAX  Take 0.5 mg by mouth 2 (two) times daily as needed for anxiety.   aspirin  81 MG tablet Take 81 mg by mouth daily.   Benadryl Allergy 25 MG tablet Generic drug: diphenhydrAMINE Take 25 mg by mouth every 6 (six) hours as needed for sleep.   buPROPion  300 MG 24 hr tablet Commonly known as: WELLBUTRIN  XL Take 300 mg by mouth daily.   CeQur Simplicity Inserter Misc APPLY PATCH AS DIRECTED AND CHANGE EVERY 3 DAYS. DOSE INSULIN  AS DIRECTED (1 SQUEEZE = 2 UNITS OF INSULIN ).   empagliflozin  25 MG Tabs tablet Commonly known as: JARDIANCE  Take 1 tablet (25 mg total) by mouth daily.   FreeStyle Libre 3 Sensor Misc Place 1 sensor on the skin every 14 days. Use to check glucose continuously What changed: Another medication with the same name was removed. Continue taking this medication, and follow the directions you see here. Changed by: Donell PARAS Marchel Foote   HYDROcodone -acetaminophen  5-325 MG tablet Commonly known as: NORCO/VICODIN Take 1-2 tablets by mouth every 6 (six) hours as needed for moderate pain or severe pain.   insulin  glargine-yfgn 100 UNIT/ML injection Commonly known as: Semglee  (yfgn) Inject 0.52 mLs (52 Units total) into the skin daily. What changed: how much to take Changed by: Donell PARAS Nyx Keady   Insulin  Pen Needle 32G X 4 MM Misc 1 Device by Does not apply route in the morning, at noon, in the evening, and at bedtime.    lisinopril 10 MG tablet Commonly known as: ZESTRIL Take 10 mg by mouth daily.   NovoLOG  FlexPen 100 UNIT/ML FlexPen Generic drug: insulin  aspart Max daily 60 units What changed:  additional instructions Another medication with the same name was removed. Continue taking this medication, and follow the directions you see here. Changed by: Steven Mays   ondansetron  4 MG tablet Commonly known as: ZOFRAN  Take 4 mg by mouth every 8 (eight) hours as needed for nausea or vomiting.   propranolol 80 MG 24 hr capsule Commonly known as: INNOPRAN XL Take 80 mg by mouth daily.   rosuvastatin  10 MG tablet Commonly known as: CRESTOR  Take 10 mg by mouth daily.   Testosterone  Cypionate 200 MG/ML Soln Inject 100 mg into the skin every 14 (fourteen) days.   traZODone  100 MG tablet  Commonly known as: DESYREL  Take 100 mg by mouth at bedtime as needed for sleep.         OBJECTIVE:   Vital Signs: BP 120/80 (BP Location: Right Arm, Patient Position: Sitting, Cuff Size: Large)   Pulse 77   Ht 5' 10 (1.778 m)   Wt 212 lb (96.2 kg)   SpO2 98%   BMI 30.42 kg/m   Wt Readings from Last 3 Encounters:  04/23/23 212 lb (96.2 kg)  09/08/22 218 lb (98.9 kg)  05/13/22 215 lb (97.5 kg)     Exam: General: Pt appears well and is in NAD  Lungs: Clear with good BS bilat   Heart: RRR   Extremities: No pretibial edema.   Neuro: MS is good with appropriate affect, pt is alert and Ox3     DM Foot Exam 04/23/2023   The skin of the feet is intact without sores or ulcerations. The pedal pulses are 2+ on right and 2+ on left. The sensation is intact to a screening 5.07, 10 gram monofilament bilaterally     DATA REVIEWED:  Lab Results  Component Value Date   HGBA1C 9.6 (A) 04/23/2023   HGBA1C 8.3 (A) 09/08/2022   HGBA1C 7.0 (A) 04/07/2022   05/13/2022 BUN 18 CR 0.7 GFR >90   ASSESSMENT / PLAN / RECOMMENDATIONS:   1) Type 2 Diabetes Mellitus, poorly controlled, Without  complications - Most recent A1c of 9.6 %. Goal A1c < 7.0 %.    -Patient with worsening glycemic control, due to variable reasons -He has not been checking glucose on a regular basis, hence not using correction scale - Intolerant to Ozempic  due to diarrhea  - He developed appendicitis while on Mounjaro  and believes there's a connection  - Developed burning sensation with injection of Trulicity , but willing to try it again -I have encouraged the patient to check glucose before each meal and use NovoLog  and correction scale as needed -I will increase Semglee  as below    MEDICATIONS:  Continue Jardiance  25 mg daily Increase Semglee  to 52 units daily Continue NovoLog  14 units 3 times daily before every meal Continue correction factor : NovoLog  (BG -130/25)   EDUCATION / INSTRUCTIONS: BG monitoring instructions: Patient is instructed to check his blood sugars 3 times a day, before meals. Call [facility] if: BG persistently < 70  I reviewed the Rule of 15 for the treatment of hypoglycemia in detail with the patient. Literature supplied.   2) Diabetic complications:  Eye: Does not at have known diabetic retinopathy.  Neuro/ Feet: Does not have known diabetic peripheral neuropathy .  Renal: Patient does not have known baseline CKD. He   is not on an ACEI/ARB at present.     F/U in 3 months   Signed electronically by: Steven Redgie Butts, MD  [facility]  [facility] [address] [place], [place] [address] Phone: [phone] FAX: [phone]   CC: Hugh Charleston, MD (Inactive) [address] [place] [address] Phone: [phone]  Fax: [phone]  Return to Endocrinology clinic as below: No future appointments.

## 2023-04-27 DIAGNOSIS — E782 Mixed hyperlipidemia: Secondary | ICD-10-CM | POA: Diagnosis not present

## 2023-04-27 DIAGNOSIS — E118 Type 2 diabetes mellitus with unspecified complications: Secondary | ICD-10-CM | POA: Diagnosis not present

## 2023-04-27 DIAGNOSIS — E291 Testicular hypofunction: Secondary | ICD-10-CM | POA: Diagnosis not present

## 2023-04-27 DIAGNOSIS — I10 Essential (primary) hypertension: Secondary | ICD-10-CM | POA: Diagnosis not present

## 2023-06-01 ENCOUNTER — Ambulatory Visit: Payer: BC Managed Care – PPO | Admitting: Internal Medicine

## 2023-08-10 NOTE — Progress Notes (Unsigned)
 Name: Steven Mays  Age/ Sex: 63 y.o., male   MRN/ DOB: [id number], Sep 24, 1960     PCP: Elida Grounds, DO   Reason for Endocrinology Evaluation: Type 2 Diabetes Mellitus  Initial Endocrine Consultative Visit: 03/22/2018    PATIENT IDENTIFIER: [name] is a 63 y.o. male with a past medical history of T2DM, anxiety and depression, OSA. The patient has followed with Endocrinology clinic since 03/22/2018 for consultative assistance with management of his diabetes.  DIABETIC HISTORY:  Mr. Caponi was diagnosed with DM 2016, intolerant to metformin due to diarrhea , and started insulin  therapy in 2023. Aaron Aas His hemoglobin A1c has ranged from 8.4% in 12/2020, peaking at 11.5% in 04/2021  He was followed by Dr. Washington Hacker from January 2020 until May 2023   On his initial visit with me 10/2021, his A1c was 11.1% he was on acrobatics, bromocriptine , glimepiride , colesevelam , Semglee  Jardiance  and Trulicity  I had stopped a carbose, bromocriptine , glimepiride , and colesevelam , increase basal insulin  started him on prandial insulin , continue Jardiance  and Trulicity    He was trained on CeQur  simplicity device 07/2022  He took Mounjaro  for approximately a month, developed appendicitis and opted not to continue on Mounjaro  as well as sulfur burps  and flatulence   Started Ozempic  08/2022 but this was discontinued 03/2023 due to diarrhea   CeQur was cost prohibitive 2025  SUBJECTIVE:   During the last visit (04/23/2023): A1c 9.6%     Today (08/11/2023): [name] is here for follow-up on diabetes management.  He has not been using freestyle libre, except for today with 298 mg/dL reading. Prior to that he has been checking with a glucose meter twice a day .   On PPI for GERD  No nausea or vomiting  NO diarrhea or constipation   HOME DIABETES REGIMEN:  Jardiance  25 mg daily Semglee  52 units daily NovoLog  14 units 3 times daily before every meal Correction factor: NovoLog  (BG -130/25)      Statin: Yes ACE-I/ARB: Yes     CONTINUOUS GLUCOSE MONITORING RECORD INTERPRETATION: not using       DIABETIC COMPLICATIONS: Microvascular complications:   Denies: CKD Last Eye Exam: Completed 06/2020  Macrovascular complications:   Denies: CAD, CVA, PVD   HISTORY:  Past Medical History:  Past Medical History:  Diagnosis Date   Anxiety    Depression    Diabetes mellitus without complication (HCC)    Essential tremor    Sleep apnea    cannot tolerate cpap   Past Surgical History:  Past Surgical History:  Procedure Laterality Date   CATARACT EXTRACTION W/ INTRAOCULAR LENS IMPLANT Left    CERVICAL LAMINECTOMY  2019   LAPAROSCOPIC APPENDECTOMY N/A 05/13/2022   Procedure: APPENDECTOMY LAPAROSCOPIC;  Surgeon: Candyce Champagne, MD;  Location: [facility];  Service: General;  Laterality: N/A;   lumbar lamincectomy  03/16/2006   TONSILLECTOMY  03/17/1979   Social History:  reports that he quit smoking about 16 years ago. His smoking use included cigarettes. He has never used smokeless tobacco. He reports current alcohol use of about 2.0 standard drinks of alcohol per week. He reports that he does not use drugs. Family History:  Family History  Problem Relation Age of Onset   Hypertension Other    Cancer Other    Colon cancer Neg Hx    Stomach cancer Neg Hx    Diabetes Neg Hx    Rectal cancer Neg Hx      HOME MEDICATIONS: Allergies as of 08/11/2023  Reactions   Effexor Xr [venlafaxine Hcl Er] Other (See Comments)   "Jumped out of my skin"   Metformin And Related Diarrhea   Nefazodone Other (See Comments)   Hallucinations (Tolerates trazodone )   Propranolol Other (See Comments)   "Depression"   Sertraline Other (See Comments)   Sexual side effects   Tetracyclines & Related Nausea And Vomiting   Amoxicillin-pot Clavulanate Rash, Other (See Comments)   Rash one time.  Tolerated other PCN & ceftriaxone  since, per patient   Clindamycin  Diarrhea, Rash, Other  (See Comments)   "Red man" syndrome         Medication List        Accurate as of Aug 11, 2023  8:08 AM. If you have any questions, ask your nurse or doctor.          Accu-Chek Guide test strip Generic drug: glucose blood 1 each by Other route daily. And lancets 1/day   ALPRAZolam  0.5 MG tablet Commonly known as: XANAX  Take 0.5 mg by mouth 2 (two) times daily as needed for anxiety.   aspirin  81 MG tablet Take 81 mg by mouth daily.   Benadryl Allergy 25 MG tablet Generic drug: diphenhydrAMINE Take 25 mg by mouth every 6 (six) hours as needed for sleep.   buPROPion  300 MG 24 hr tablet Commonly known as: WELLBUTRIN  XL Take 300 mg by mouth daily.   CeQur Simplicity Inserter Misc APPLY PATCH AS DIRECTED AND CHANGE EVERY 3 DAYS. DOSE INSULIN  AS DIRECTED (1 SQUEEZE = 2 UNITS OF INSULIN ).   empagliflozin  25 MG Tabs tablet Commonly known as: JARDIANCE  Take 1 tablet (25 mg total) by mouth daily.   FreeStyle Libre 3 Sensor Misc Place 1 sensor on the skin every 14 days. Use to check glucose continuously   HYDROcodone -acetaminophen  5-325 MG tablet Commonly known as: NORCO/VICODIN Take 1-2 tablets by mouth every 6 (six) hours as needed for moderate pain or severe pain.   insulin  glargine-yfgn 100 UNIT/ML injection Commonly known as: Semglee  (yfgn) Inject 0.52 mLs (52 Units total) into the skin daily.   Insulin  Pen Needle 32G X 4 MM Misc 1 Device by Does not apply route in the morning, at noon, in the evening, and at bedtime.   lisinopril 10 MG tablet Commonly known as: ZESTRIL Take 10 mg by mouth daily.   NovoLOG  FlexPen 100 UNIT/ML FlexPen Generic drug: insulin  aspart Max daily 60 units   ondansetron  4 MG tablet Commonly known as: ZOFRAN  Take 4 mg by mouth every 8 (eight) hours as needed for nausea or vomiting.   propranolol 80 MG 24 hr capsule Commonly known as: INNOPRAN XL Take 80 mg by mouth daily.   rosuvastatin  10 MG tablet Commonly known as:  CRESTOR  Take 10 mg by mouth daily.   Testosterone  Cypionate 200 MG/ML Soln Inject 100 mg into the skin every 14 (fourteen) days.   traZODone  100 MG tablet Commonly known as: DESYREL  Take 100 mg by mouth at bedtime as needed for sleep.         OBJECTIVE:   Vital Signs: BP 126/82 (BP Location: Left Arm, Patient Position: Sitting, Cuff Size: Normal)   Pulse (!) 105   Ht 5\' 10"  (1.778 m)   Wt 211 lb (95.7 kg)   SpO2 91%   BMI 30.28 kg/m   Wt Readings from Last 3 Encounters:  08/11/23 211 lb (95.7 kg)  04/23/23 212 lb (96.2 kg)  09/08/22 218 lb (98.9 kg)     Exam: General: Pt appears well  and is in NAD  Lungs: Clear with good BS bilat   Heart: RRR   Extremities: No pretibial edema.   Neuro: MS is good with appropriate affect, pt is alert and Ox3     DM Foot Exam 04/23/2023   The skin of the feet is intact without sores or ulcerations. The pedal pulses are 2+ on right and 2+ on left. The sensation is intact to a screening 5.07, 10 gram monofilament bilaterally     DATA REVIEWED:  Lab Results  Component Value Date   HGBA1C 7.7 (A) 08/11/2023   HGBA1C 9.6 (A) 04/23/2023   HGBA1C 8.3 (A) 09/08/2022   05/13/2022 BUN 18 CR 0.7 GFR >90   ASSESSMENT / PLAN / RECOMMENDATIONS:   1) Type 2 Diabetes Mellitus, Sub-Optimally  controlled, Without complications - Most recent A1c of 7.7 %. Goal A1c < 7.0 %.    -Patient with worsening glycemic control, due to variable reasons -He has not been checking glucose on a regular basis, hence not using correction scale - Intolerant to Ozempic  due to diarrhea  - He developed appendicitis while on Mounjaro  and believes there's a connection  - Developed burning sensation with injection of Trulicity , but willing to try it again -I have encouraged the patient to check glucose before each meal and use NovoLog  and correction scale as needed -I will increase Semglee  as below    MEDICATIONS:  Continue Jardiance  25 mg  daily Increase Semglee  to 52 units daily Continue NovoLog  14 units 3 times daily before every meal Continue correction factor : NovoLog  (BG -130/25)   EDUCATION / INSTRUCTIONS: BG monitoring instructions: Patient is instructed to check his blood sugars 3 times a day, before meals. Call [place] [facility] if: BG persistently < 70  I reviewed the Rule of 15 for the treatment of hypoglycemia in detail with the patient. Literature supplied.   2) Diabetic complications:  Eye: Does not at have known diabetic retinopathy.  Neuro/ Feet: Does not have known diabetic peripheral neuropathy .  Renal: Patient does not have known baseline CKD. He   is not on an ACEI/ARB at present.     F/U in 3 months   Signed electronically by: Natale Bail, MD  [facility]  [facility] [address] [place], [place] [address] Phone: [phone] FAX: [phone]   CC: Elida Grounds, DO [address] [place] [address] Phone: [phone]  Fax: [phone]  Return to Endocrinology clinic as below: No future appointments.

## 2023-08-11 ENCOUNTER — Ambulatory Visit: Payer: BC Managed Care – PPO | Admitting: Internal Medicine

## 2023-08-11 ENCOUNTER — Encounter: Payer: Self-pay | Admitting: Internal Medicine

## 2023-08-11 VITALS — BP 126/82 | HR 105 | Ht 70.0 in | Wt 211.0 lb

## 2023-08-11 DIAGNOSIS — Z794 Long term (current) use of insulin: Secondary | ICD-10-CM | POA: Diagnosis not present

## 2023-08-11 DIAGNOSIS — E1165 Type 2 diabetes mellitus with hyperglycemia: Secondary | ICD-10-CM

## 2023-08-11 LAB — POCT GLYCOSYLATED HEMOGLOBIN (HGB A1C): Hemoglobin A1C: 7.7 % — AB (ref 4.0–5.6)

## 2023-08-11 MED ORDER — FREESTYLE LIBRE 2 PLUS SENSOR MISC: 1.0000 | 3 refills | Status: AC

## 2023-08-11 MED ORDER — OMNIPOD 5 G7 PODS (GEN 5) MISC: 1.0000 | 3 refills | Status: DC

## 2023-08-11 MED ORDER — INSULIN GLARGINE-YFGN 100 UNIT/ML ~~LOC~~ SOLN: 54.0000 [IU] | Freq: Every day | SUBCUTANEOUS | 4 refills | Status: DC

## 2023-08-11 MED ORDER — OMNIPOD 5 LIBRE2 PLUS G6 PODS MISC: 1.0000 | 3 refills | Status: AC

## 2023-08-11 MED ORDER — OMNIPOD 5 LIBRE2 PLUS G6 KIT: 1.0000 | PACK | 0 refills | Status: AC

## 2023-08-11 MED ORDER — OMNIPOD 5 G7 INTRO (GEN 5) KIT: 1.0000 | PACK | 0 refills | Status: DC

## 2023-08-11 MED ORDER — TRULICITY 0.75 MG/0.5ML ~~LOC~~ SOAJ: 0.7500 mg | SUBCUTANEOUS | 3 refills | Status: DC

## 2023-08-11 NOTE — Patient Instructions (Signed)
 Start Trulicity  0.75 mg weekly  Continue Jardiance  25 mg daily  Novolog  14 units with each meal ( as a baseline ) Increase  Semglee  to 54 units ONCE daily  Novolog  correctional insulin : ADD extra units on insulin  to your meal-time Novolog  dose if your blood sugars are higher than 155. Use the scale below to help guide you:   Blood sugar before meal Number of units to inject  Less than 155 0 unit  156 -  180 1 units  181 -  205 2 units  206 -  230 3 units  231 -  255 4 units  256 -  280 5 units  281 -  305 6 units  306 -  330 7 units  331 -  355 8 units  356 - 380 9 units  381 - 405 10 units   406 - 430 11 units     HOW TO TREAT LOW BLOOD SUGARS (Blood sugar LESS THAN 70 MG/DL) Please follow the RULE OF 15 for the treatment of hypoglycemia treatment (when your (blood sugars are less than 70 mg/dL)   STEP 1: Take 15 grams of carbohydrates when your blood sugar is low, which includes:  3-4 GLUCOSE TABS  OR 3-4 OZ OF JUICE OR REGULAR SODA OR ONE TUBE OF GLUCOSE GEL    STEP 2: RECHECK blood sugar in 15 MINUTES STEP 3: If your blood sugar is still low at the 15 minute recheck --> then, go back to STEP 1 and treat AGAIN with another 15 grams of carbohydrates.

## 2023-09-09 ENCOUNTER — Telehealth: Payer: Self-pay

## 2023-09-09 ENCOUNTER — Other Ambulatory Visit (HOSPITAL_COMMUNITY): Payer: Self-pay

## 2023-09-09 NOTE — Telephone Encounter (Signed)
 Omnipod and Novolod need PA

## 2023-09-09 NOTE — Telephone Encounter (Signed)
 Pharmacy Patient Advocate Encounter  Received notification from [facility] that Prior Authorization for Omnipod 5 Libre2 plus G6 kit has been APPROVED from 09/09/23 to 09/08/24

## 2023-09-09 NOTE — Telephone Encounter (Signed)
 Pharmacy Patient Advocate Encounter   Received notification from Pt Calls Mess[employer] that prior authorization for Novolog  is required/requested.   Insurance verification completed.   The patient is insured through [facility] .   Per test claim: The current 30 day co-pay is, $60.  No PA needed at this time. This test claim was processed through [facility]- copay amounts may vary at other pharmacies due to pharmacy/plan contracts, or as the patient moves through the different stages of their insurance plan.

## 2023-09-09 NOTE — Telephone Encounter (Signed)
 Pharmacy Patient Advocate Encounter   Received notification from Pt Calls Messages that prior authorization for omnipod 5 Libre2 Plus G6 kit is required/requested.   Insurance verification completed.   The patient is insured through [facility] .   Per test claim: PA required; PA submitted to above mentioned insurance via CoverMyMeds Key/confirmation #/EOC [id number] Status is pending

## 2023-09-13 NOTE — Telephone Encounter (Signed)
 Patient notified and states that he was able to pick up 5 Novolog  pens from [facility]

## 2023-09-13 NOTE — Telephone Encounter (Signed)
 Patient notified

## 2023-10-26 DIAGNOSIS — Z5181 Encounter for therapeutic drug level monitoring: Secondary | ICD-10-CM | POA: Diagnosis not present

## 2023-10-26 DIAGNOSIS — Z0000 Encounter for general adult medical examination without abnormal findings: Secondary | ICD-10-CM | POA: Diagnosis not present

## 2023-10-26 DIAGNOSIS — I10 Essential (primary) hypertension: Secondary | ICD-10-CM | POA: Diagnosis not present

## 2023-10-26 DIAGNOSIS — Z125 Encounter for screening for malignant neoplasm of prostate: Secondary | ICD-10-CM | POA: Diagnosis not present

## 2023-10-26 DIAGNOSIS — E118 Type 2 diabetes mellitus with unspecified complications: Secondary | ICD-10-CM | POA: Diagnosis not present

## 2023-10-26 DIAGNOSIS — E291 Testicular hypofunction: Secondary | ICD-10-CM | POA: Diagnosis not present

## 2023-10-26 DIAGNOSIS — E782 Mixed hyperlipidemia: Secondary | ICD-10-CM | POA: Diagnosis not present

## 2023-10-26 LAB — LIPID PANEL
Cholesterol: 136 (ref 0–200)
HDL: 37 (ref 35–70)
LDL Cholesterol: 52
LDl/HDL Ratio: 3.7
Triglycerides: 300 — AB (ref 40–160)

## 2023-10-26 LAB — MICROALBUMIN / CREATININE URINE RATIO: Microalb Creat Ratio: 65.2

## 2023-10-26 LAB — HEPATIC FUNCTION PANEL
ALT: 15 U/L (ref 10–40)
AST: 21 (ref 14–40)
Alkaline Phosphatase: 56 (ref 25–125)

## 2023-10-26 LAB — PSA: PSA: 1.3

## 2023-10-26 LAB — BASIC METABOLIC PANEL WITH GFR
BUN: 18 (ref 4–21)
CO2: 29 — AB (ref 13–22)
Chloride: 101 (ref 99–108)
Creatinine: 0.8 (ref 0.6–1.3)
Glucose: 252
Potassium: 4.1 meq/L (ref 3.5–5.1)
Sodium: 137 (ref 137–147)

## 2023-10-26 LAB — CBC AND DIFFERENTIAL
HCT: 45 (ref 41–53)
Hemoglobin: 15.3 (ref 13.5–17.5)
Platelets: 184 K/uL (ref 150–400)
WBC: 6.1

## 2023-10-26 LAB — CBC: RBC: 4.89 (ref 3.87–5.11)

## 2023-10-26 LAB — PROTEIN / CREATININE RATIO, URINE: Creatinine, Urine: 40

## 2023-10-26 LAB — MICROALBUMIN, URINE: Microalb, Ur: 2.58

## 2023-10-26 LAB — COMPREHENSIVE METABOLIC PANEL WITH GFR
Albumin: 4.4 (ref 3.5–5.0)
Calcium: 9.6 (ref 8.7–10.7)
eGFR: 100

## 2023-10-26 LAB — HEMOGLOBIN A1C: Hemoglobin A1C: 6.5

## 2023-11-16 ENCOUNTER — Encounter: Payer: Self-pay | Admitting: Internal Medicine

## 2023-11-16 ENCOUNTER — Ambulatory Visit (INDEPENDENT_AMBULATORY_CARE_PROVIDER_SITE_OTHER): Admitting: Internal Medicine

## 2023-11-16 VITALS — BP 122/88 | HR 106 | Ht 70.0 in | Wt 210.0 lb

## 2023-11-16 DIAGNOSIS — Z7985 Long-term (current) use of injectable non-insulin antidiabetic drugs: Secondary | ICD-10-CM | POA: Diagnosis not present

## 2023-11-16 DIAGNOSIS — Z794 Long term (current) use of insulin: Secondary | ICD-10-CM

## 2023-11-16 DIAGNOSIS — Z7984 Long term (current) use of oral hypoglycemic drugs: Secondary | ICD-10-CM | POA: Diagnosis not present

## 2023-11-16 DIAGNOSIS — E119 Type 2 diabetes mellitus without complications: Secondary | ICD-10-CM | POA: Diagnosis not present

## 2023-11-16 LAB — POCT GLUCOSE (DEVICE FOR HOME USE): POC Glucose: 280 mg/dL — AB (ref 70–99)

## 2023-11-16 MED ORDER — TRULICITY 1.5 MG/0.5ML ~~LOC~~ SOAJ: 1.5000 mg | SUBCUTANEOUS | 3 refills | Status: AC

## 2023-11-16 NOTE — Progress Notes (Signed)
 Name: Steven Mays  Age/ Sex: 63 y.o., male   MRN/ DOB: [id number], 03/09/1961     PCP: Auston Opal, DO   Reason for Endocrinology Evaluation: Type 2 Diabetes Mellitus  Initial Endocrine Consultative Visit: 03/22/2018    PATIENT IDENTIFIER: Steven Mays is a 63 y.o. male with a past medical history of T2DM, anxiety and depression, OSA. The patient has followed with Endocrinology clinic since 03/22/2018 for consultative assistance with management of his diabetes.  DIABETIC HISTORY:  Steven Mays was diagnosed with DM 2016, intolerant to metformin due to diarrhea , and started insulin  therapy in 2023. SABRA His hemoglobin A1c has ranged from 8.4% in 12/2020, peaking at 11.5% in 04/2021  He was followed by Dr. Kassie from January 2020 until May 2023   On his initial visit with me 10/2021, his A1c was 11.1% he was on acrobatics, bromocriptine , glimepiride , colesevelam , Semglee  Jardiance  and Trulicity  I had stopped a carbose, bromocriptine , glimepiride , and colesevelam , increase basal insulin  started him on prandial insulin , continue Jardiance  and Trulicity    He was trained on CeQur  simplicity device 07/2022  He took Mounjaro  for approximately a month, developed appendicitis and opted not to continue on Mounjaro  as well as sulfur burps  and flatulence   Started Ozempic  08/2022 but this was discontinued 03/2023 due to diarrhea   CeQur was cost prohibitive 2025  Trulicity  started May, 2025  SUBJECTIVE:   During the last visit (08/11/2023): A1c 7.7%     Today (11/16/2023): Steven Mays is here for follow-up on diabetes management.  He has been checking glucose 3x daily .  He has not been consistent with using freestyle libre, as the sensor keeps falling off.  No hypoglycemia   Weight stable Denies nausea or vomiting  On Omeprazole for GERD  NO diarrhea or constipation     HOME DIABETES REGIMEN:  Jardiance  25 mg daily Trulicity  0.75 mg weekly Semglee  54 units daily NovoLog  14 units 3  times daily before every meal Correction factor: NovoLog  (BG -130/25)     Statin: Yes ACE-I/ARB: Yes      DIABETIC COMPLICATIONS: Microvascular complications:   Denies: CKD Last Eye Exam: Completed 06/2020  Macrovascular complications:   Denies: CAD, CVA, PVD   HISTORY:  Past Medical History:  Past Medical History:  Diagnosis Date   Anxiety    Depression    Diabetes mellitus without complication (HCC)    Essential tremor    Sleep apnea    cannot tolerate cpap   Past Surgical History:  Past Surgical History:  Procedure Laterality Date   CATARACT EXTRACTION W/ INTRAOCULAR LENS IMPLANT Left    CERVICAL LAMINECTOMY  2019   LAPAROSCOPIC APPENDECTOMY N/A 05/13/2022   Procedure: APPENDECTOMY LAPAROSCOPIC;  Surgeon: Sheldon Standing, MD;  Location: [facility];  Service: General;  Laterality: N/A;   lumbar lamincectomy  03/16/2006   TONSILLECTOMY  03/17/1979   Social History:  reports that he quit smoking about 16 years ago. His smoking use included cigarettes. He has never used smokeless tobacco. He reports current alcohol use of about 2.0 standard drinks of alcohol per week. He reports that he does not use drugs. Family History:  Family History  Problem Relation Age of Onset   Hypertension Other    Cancer Other    Colon cancer Neg Hx    Stomach cancer Neg Hx    Diabetes Neg Hx    Rectal cancer Neg Hx      HOME MEDICATIONS: Allergies as of 11/16/2023  Reactions   Effexor Xr [venlafaxine Hcl Er] Other (See Comments)   Jumped out of my skin   Metformin And Related Diarrhea   Nefazodone Other (See Comments)   Hallucinations (Tolerates trazodone )   Propranolol Other (See Comments)   Depression   Sertraline Other (See Comments)   Sexual side effects   Tetracyclines & Related Nausea And Vomiting   Amoxicillin-pot Clavulanate Rash, Other (See Comments)   Rash one time.  Tolerated other PCN & ceftriaxone  since, per patient   Clindamycin  Diarrhea, Rash, Other  (See Comments)   Red man syndrome         Medication List        Accurate as of November 16, 2023  8:02 AM. If you have any questions, ask your nurse or doctor.          Accu-Chek Guide test strip Generic drug: glucose blood 1 each by Other route daily. And lancets 1/day   ALPRAZolam  0.5 MG tablet Commonly known as: XANAX  Take 0.5 mg by mouth 2 (two) times daily as needed for anxiety.   aspirin  81 MG tablet Take 81 mg by mouth daily.   Benadryl Allergy 25 MG tablet Generic drug: diphenhydrAMINE Take 25 mg by mouth every 6 (six) hours as needed for sleep.   buPROPion  300 MG 24 hr tablet Commonly known as: WELLBUTRIN  XL Take 300 mg by mouth daily.   empagliflozin  25 MG Tabs tablet Commonly known as: JARDIANCE  Take 1 tablet (25 mg total) by mouth daily.   FreeStyle Libre 2 Plus Sensor Misc 1 Device by Does not apply route every 14 (fourteen) days.   HYDROcodone -acetaminophen  5-325 MG tablet Commonly known as: NORCO/VICODIN Take 1-2 tablets by mouth every 6 (six) hours as needed for moderate pain or severe pain.   insulin  glargine-yfgn 100 UNIT/ML injection Commonly known as: Semglee  (yfgn) Inject 0.54 mLs (54 Units total) into the skin daily.   Insulin  Pen Needle 32G X 4 MM Misc 1 Device by Does not apply route in the morning, at noon, in the evening, and at bedtime.   lisinopril 10 MG tablet Commonly known as: ZESTRIL Take 10 mg by mouth daily.   NovoLOG  FlexPen 100 UNIT/ML FlexPen Generic drug: insulin  aspart Max daily 60 units   Omnipod 5 Libre2 Plus G6 Kit 1 Device by Does not apply route every 3 (three) days.   Omnipod 5 Libre2 Plus G6 Pods Misc 1 Device by Does not apply route every 3 (three) days.   ondansetron  4 MG tablet Commonly known as: ZOFRAN  Take 4 mg by mouth every 8 (eight) hours as needed for nausea or vomiting.   propranolol 80 MG 24 hr capsule Commonly known as: INNOPRAN XL Take 80 mg by mouth daily.   rosuvastatin  10 MG  tablet Commonly known as: CRESTOR  Take 10 mg by mouth daily.   Testosterone  Cypionate 200 MG/ML Soln Inject 100 mg into the skin every 14 (fourteen) days.   traZODone  100 MG tablet Commonly known as: DESYREL  Take 100 mg by mouth at bedtime as needed for sleep.   Trulicity  0.75 MG/0.5ML Soaj Generic drug: Dulaglutide  Inject 0.75 mg into the skin once a week.         OBJECTIVE:   Vital Signs: BP 122/88 (BP Location: Left Arm, Patient Position: Sitting, Cuff Size: Normal)   Pulse (!) 106   Ht 5' 10 (1.778 m)   Wt 210 lb (95.3 kg)   SpO2 93%   BMI 30.13 kg/m   Wt Readings from Last  3 Encounters:  11/16/23 210 lb (95.3 kg)  08/11/23 211 lb (95.7 kg)  04/23/23 212 lb (96.2 kg)     Exam: General: Pt appears well and is in NAD  Lungs: Clear with good BS bilat   Heart: RRR   Extremities: No pretibial edema.   Neuro: MS is good with appropriate affect, pt is alert and Ox3     DM Foot Exam 04/23/2023   The skin of the feet is intact without sores or ulcerations. The pedal pulses are 2+ on right and 2+ on left. The sensation is intact to a screening 5.07, 10 gram monofilament bilaterally     DATA REVIEWED:  Lab Results  Component Value Date   HGBA1C 7.7 (A) 08/11/2023   HGBA1C 9.6 (A) 04/23/2023   HGBA1C 8.3 (A) 09/08/2022   10/26/2023 A1c 6.5% BUN 16 GFR 100 Glucose 252 HDL 37 LDL 52 MA/CR ratio 2.5 HCT Triglycerides 300   In office BG 280 mg/DL   Old records , labs and images have been reviewed.    ASSESSMENT / PLAN / RECOMMENDATIONS:   1) Type 2 Diabetes Mellitus, Optimally  controlled, Without complications - Most recent A1c of 6.5 %. Goal A1c < 7.0 %.    -A1c has optimized - Intolerant to Ozempic  due to diarrhea  - He developed appendicitis while on Mounjaro  and believes there's a connection  - Developed burning sensation with injection of Trulicity , but opted to restart Trulicity , so far this has been tolerable without side effects, will  increase dose as below -He was approved for the OmniPod in June, 2025, patient will contact our CDE for scheduling - No other changes  MEDICATIONS:  Increase Trulicity  1.5 mg weekly Continue Jardiance  25 mg daily Continue Semglee  to 54 units daily Continue NovoLog  14 units 3 times daily before every meal Continue correction factor : NovoLog  (BG -130/25)   EDUCATION / INSTRUCTIONS: BG monitoring instructions: Patient is instructed to check his blood sugars 3 times a day, before meals. Call [facility] if: BG persistently < 70  I reviewed the Rule of 15 for the treatment of hypoglycemia in detail with the patient. Literature supplied.   2) Diabetic complications:  Eye: Does not at have known diabetic retinopathy.  Neuro/ Feet: Does not have known diabetic peripheral neuropathy .  Renal: Patient does not have known baseline CKD. He   is not on an ACEI/ARB at present.     F/U in 6 months   Signed electronically by: Stefano Redgie Butts, MD  [facility]  [facility] [address] [place] [place], [place] [address] Phone: [phone] FAX: [phone]   CC: Auston Opal, DO [address] [place] [address] Phone: [phone]  Fax: [phone]  Return to Endocrinology clinic as below: No future appointments.

## 2023-11-16 NOTE — Patient Instructions (Signed)
 Increase Trulicity  1.5  mg weekly  Continue Jardiance  25 mg daily  Novolog  14 units with each meal ( as a baseline ) Continue Semglee  to 54 units ONCE daily  Novolog  correctional insulin : ADD extra units on insulin  to your meal-time Novolog  dose if your blood sugars are higher than 155. Use the scale below to help guide you:   Blood sugar before meal Number of units to inject  Less than 155 0 unit  156 -  180 1 units  181 -  205 2 units  206 -  230 3 units  231 -  255 4 units  256 -  280 5 units  281 -  305 6 units  306 -  330 7 units  331 -  355 8 units  356 - 380 9 units  381 - 405 10 units   406 - 430 11 units     HOW TO TREAT LOW BLOOD SUGARS (Blood sugar LESS THAN 70 MG/DL) Please follow the RULE OF 15 for the treatment of hypoglycemia treatment (when your (blood sugars are less than 70 mg/dL)   STEP 1: Take 15 grams of carbohydrates when your blood sugar is low, which includes:  3-4 GLUCOSE TABS  OR 3-4 OZ OF JUICE OR REGULAR SODA OR ONE TUBE OF GLUCOSE GEL    STEP 2: RECHECK blood sugar in 15 MINUTES STEP 3: If your blood sugar is still low at the 15 minute recheck --> then, go back to STEP 1 and treat AGAIN with another 15 grams of carbohydrates.

## 2023-12-07 DIAGNOSIS — Z23 Encounter for immunization: Secondary | ICD-10-CM | POA: Diagnosis not present

## 2023-12-16 ENCOUNTER — Telehealth: Payer: Self-pay

## 2023-12-16 MED ORDER — TRESIBA FLEXTOUCH 200 UNIT/ML ~~LOC~~ SOPN: 54.0000 [IU] | PEN_INJECTOR | SUBCUTANEOUS | 3 refills | Status: AC

## 2023-12-16 NOTE — Telephone Encounter (Signed)
 Per [facility] Semglee  is on backorder. Would you like to substitute for something else?

## 2023-12-16 NOTE — Telephone Encounter (Signed)
 Patient has been notified

## 2024-03-02 DIAGNOSIS — E119 Type 2 diabetes mellitus without complications: Secondary | ICD-10-CM

## 2024-05-19 ENCOUNTER — Telehealth: Admitting: Internal Medicine
